# Patient Record
Sex: Female | Born: 1960
Health system: Southern US, Community
[De-identification: ages and names within clinical notes are randomized; demographics above are authoritative.]

## PROBLEM LIST (undated history)

## (undated) DIAGNOSIS — G4730 Sleep apnea, unspecified: Secondary | ICD-10-CM

## (undated) DIAGNOSIS — N189 Chronic kidney disease, unspecified: Secondary | ICD-10-CM

## (undated) DIAGNOSIS — H269 Unspecified cataract: Secondary | ICD-10-CM

## (undated) DIAGNOSIS — E785 Hyperlipidemia, unspecified: Secondary | ICD-10-CM

## (undated) DIAGNOSIS — N201 Calculus of ureter: Secondary | ICD-10-CM

## (undated) DIAGNOSIS — E079 Disorder of thyroid, unspecified: Secondary | ICD-10-CM

## (undated) DIAGNOSIS — T7840XA Allergy, unspecified, initial encounter: Secondary | ICD-10-CM

## (undated) DIAGNOSIS — E119 Type 2 diabetes mellitus without complications: Secondary | ICD-10-CM

## (undated) DIAGNOSIS — Z8719 Personal history of other diseases of the digestive system: Secondary | ICD-10-CM

## (undated) HISTORY — DX: Sleep apnea, unspecified: G47.30

## (undated) HISTORY — DX: Type 2 diabetes mellitus without complications: E11.9

## (undated) HISTORY — DX: Chronic kidney disease, unspecified: N18.9

## (undated) HISTORY — DX: Disorder of thyroid, unspecified: E07.9

## (undated) HISTORY — DX: Personal history of other diseases of the digestive system: Z87.19

## (undated) HISTORY — DX: Hyperlipidemia, unspecified: E78.5

## (undated) HISTORY — DX: Unspecified cataract: H26.9

## (undated) HISTORY — PX: OTHER SURGICAL HISTORY: SHX169

## (undated) HISTORY — DX: Allergy, unspecified, initial encounter: T78.40XA

---

## 1998-03-25 ENCOUNTER — Emergency Department (HOSPITAL_COMMUNITY): Admission: EM | Admit: 1998-03-25 | Discharge: 1998-03-25 | Payer: Self-pay | Admitting: Obstetrics & Gynecology

## 1998-06-16 ENCOUNTER — Encounter: Payer: Self-pay | Admitting: Family Medicine

## 1998-06-16 ENCOUNTER — Ambulatory Visit (HOSPITAL_COMMUNITY): Admission: RE | Admit: 1998-06-16 | Discharge: 1998-06-16 | Payer: Self-pay | Admitting: Family Medicine

## 1998-07-30 HISTORY — PX: NASAL SEPTUM SURGERY: SHX37

## 1999-10-16 ENCOUNTER — Other Ambulatory Visit: Admission: RE | Admit: 1999-10-16 | Discharge: 1999-10-16 | Payer: Self-pay | Admitting: *Deleted

## 1999-11-02 ENCOUNTER — Encounter (INDEPENDENT_AMBULATORY_CARE_PROVIDER_SITE_OTHER): Payer: Self-pay | Admitting: Specialist

## 1999-11-02 ENCOUNTER — Other Ambulatory Visit: Admission: RE | Admit: 1999-11-02 | Discharge: 1999-11-02 | Payer: Self-pay | Admitting: *Deleted

## 2000-05-04 ENCOUNTER — Ambulatory Visit (HOSPITAL_BASED_OUTPATIENT_CLINIC_OR_DEPARTMENT_OTHER): Admission: RE | Admit: 2000-05-04 | Discharge: 2000-05-04 | Payer: Self-pay | Admitting: *Deleted

## 2000-11-25 ENCOUNTER — Other Ambulatory Visit: Admission: RE | Admit: 2000-11-25 | Discharge: 2000-11-25 | Payer: Self-pay | Admitting: *Deleted

## 2001-05-29 ENCOUNTER — Encounter: Payer: Self-pay | Admitting: Family Medicine

## 2001-05-29 ENCOUNTER — Encounter: Admission: RE | Admit: 2001-05-29 | Discharge: 2001-05-29 | Payer: Self-pay | Admitting: Family Medicine

## 2001-09-04 ENCOUNTER — Encounter: Admission: RE | Admit: 2001-09-04 | Discharge: 2001-09-04 | Payer: Self-pay | Admitting: Obstetrics and Gynecology

## 2001-09-04 ENCOUNTER — Encounter: Payer: Self-pay | Admitting: Obstetrics and Gynecology

## 2001-11-26 ENCOUNTER — Other Ambulatory Visit: Admission: RE | Admit: 2001-11-26 | Discharge: 2001-11-26 | Payer: Self-pay | Admitting: *Deleted

## 2003-01-19 ENCOUNTER — Other Ambulatory Visit: Admission: RE | Admit: 2003-01-19 | Discharge: 2003-01-19 | Payer: Self-pay | Admitting: *Deleted

## 2005-12-02 ENCOUNTER — Encounter: Admission: RE | Admit: 2005-12-02 | Discharge: 2005-12-02 | Payer: Self-pay | Admitting: Orthopedic Surgery

## 2007-11-22 ENCOUNTER — Observation Stay (HOSPITAL_COMMUNITY): Admission: EM | Admit: 2007-11-22 | Discharge: 2007-11-23 | Payer: Self-pay | Admitting: Emergency Medicine

## 2008-02-12 ENCOUNTER — Ambulatory Visit (HOSPITAL_COMMUNITY): Admission: RE | Admit: 2008-02-12 | Discharge: 2008-02-12 | Payer: Self-pay | Admitting: Internal Medicine

## 2010-12-12 NOTE — H&P (Signed)
[name], [name] NO.:  [id number]   MEDICAL RECORD NO.:  [id number]          PATIENT TYPE:  EMS   LOCATION:  MAJO                         FACILITY:  [facility]   PHYSICIAN:  Marcellus Scott, MD     DATE OF BIRTH:  August 28, 1960   DATE OF ADMISSION:  11/22/2007  DATE OF DISCHARGE:                              HISTORY & PHYSICAL   PRIMARY MEDICAL DOCTOR:  Dr. Juline Patch.   GYNECOLOGIST:  At [facility].   CHIEF COMPLAINTS:  Headache, dry cough, chest pain, low-grade fever,  generalized body aches, diarrhea.   HISTORY OF PRESENTING ILLNESS:  [name] is a pleasant 50 year old  Caucasian female with a history of borderline diabetes, hyperlipidemia  and a mini stroke with no residual deficits sustained at age 61.  She  was in her usual state of health until yesterday morning.  The patient  woke up yesterday morning with a dull, aching headache on the vertex  which was moderate in intensity, nonradiating.  She indicates that she  has been having a headache maybe once every 3 weeks, which she thinks is  related to her stress since being laid off from her job in October of  2008.  By noon yesterday the patient started having a nonproductive  cough with associated retrosternal chest pain on coughing.  She also had  low-grade temperatures of 100 degrees Fahrenheit.  She had generalized  body aches.  She had a couple of episodes of diarrhea, which then  resolved.  There was no nausea, vomiting.  She had some chills but no  rigors.  Towards the evening she noticed that her chest pain moved  towards the left anterior upper chest, which was stabbing in nature,  worse on taking deep breaths and on movement of the chest wall.  If she  stayed still, the chest pain was minimal.  She also was unable to take  deep breaths secondary to the pain.   There is no history of recent long-distance travel.  There is no history  of sickly contacts.  She has 2  pet dogs which have  been with her for  years.  One of them is being treated for an ear infection.  There is no  history of weight loss or night sweats.  The patient indicates that she  has been doing yard work with her friend over the last week.  A week ago  she did some strenuous exercise in her yard when she planted some trees  and spread some mulch.  She denies lifting any heavy weights or  spraining her shoulder at that time.   PAST MEDICAL HISTORY:  1. Borderline diabetes.  2. Hyperlipidemia.  3. Mini stroke at age 32 years.  4. Nonsurgically treated left shoulder dislocation.  5. Hypothyroid   PAST SURGICAL HISTORY:  1. Bilateral jaw reduction surgery.  2. Deviated nasal septum surgery.   PSYCHIATRIC HISTORY:  Depression.   ALLERGIES:  SULFA OR SEPTRA CAUSE A RASH.   MEDICATIONS:  1. Sertraline 50 mg p.o. daily.  2. Synthroid 125 mcg p.o. daily.  3. Crestor 20 mg p.o. daily.  4. Fexofenadine 180 mg p.o. daily.  5. Previous oral contraceptive pills - stopped 2 months ago.   FAMILY HISTORY:  1. Brother age 54 years with history of question cytoblastoma.  2. Dad's demise at age 67 years from throat cancer.  He also had      alcoholism and Alzheimer's dementia.  3. Mom's demise at age 20 years from lung cancer.  She had      hyperlipidemia, hypertension and alcoholism.   SOCIAL HISTORY:  The patient is divorced.  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  There is no history of smoking or drug  abuse.  She socially consumes alcohol.   REVIEW OF SYSTEMS:  Fourteen systems reviewed and apart from history of  presenting illness, is contributory only for stuffiness of both ears.   PHYSICAL EXAMINATION:  [name] is a moderately-built and obese  female patient in no obvious distress.  Temperature maximum in the ED 100.5 degrees Fahrenheit.  Current  temperature 99.3 degrees Fahrenheit.  Blood pressure 133/70, pulse 90  per minute, respirations 20 per minute, saturating  at 99% on room air.  Head, eyes, ENT is nontraumatic, normocephalic.  Pupils equally reactive  to light and accommodation.  Oral cavity with no oropharyngeal erythema  or thrush.  Ear exam normal.  NECK:  Supple.  No JVD, carotid bruit.  LYMPHATICS:  No lymphadenopathy.  BREASTS:  Exam deferred.  RESPIRATORY SYSTEM:  Clear to auscultation.  Tenderness along the left  fourth, fifth and sixth costochondral junction and left upper anterior  chest.  CARDIOVASCULAR SYSTEM:  First and second heart sounds heard.  No third  or fourth heart sounds or murmurs.  ABDOMEN:  Obese.  Nontender.  No organomegaly or mass appreciated.  Bowel sounds are normally heard.  CENTRAL NERVOUS SYSTEM:  The patient is awake, alert, oriented x3.  No  focal neurological deficits.  EXTREMITIES:  With no cyanosis, clubbing or edema.  Peripheral pulses  symmetrically felt.  SKIN:  Without any rashes.   PERTINENT LAB DATA:  Point of care cardiac markers x2 negative  Electrolytes:  Glucose 154, otherwise normal.  BUN is 11, creatinine 1.  D-dimer elevated at 0.98.  CBCs:  Hemoglobin 13.3, hematocrit 38.8,  white blood cell 10.8, platelets 327.  EKG with normal sinus rhythm at  89 beats per minute, no acute changes, left anterior fascicular block.  Radiology:  CT angiogram of the chest.  Impression:  A.  There is no  specific evidence for acute pulmonary embolus.  B.  Bilateral pulmonary  nodules in an acute setting in a patient who has fever and chest pain;  this may represent septic emboli.  The differential diagnosis includes  pulmonary metastatic disease.  Granulomatous inflammation or infection  may have a similar appearance but is considered less favored.  Chest x-  ray:  Impression:  No active disease.   ASSESSMENT AND PLAN:  1. Atypical chest pain, low-grade temperature, myalgia.  Question      costochondritis, question viral syndrome.  Will admit the patient      to tele for 23-hour observation.  Will cycle  cardiac enzymes.  Will      obtain blood cultures, urinalysis, sputum culture, LFTs, ESR, ANA.      Will check urine histoplasma antigen and serum cryptococcal      antigen.  A differential diagnosis such as a fungal etiology      appears rare though.  We will place on  aspirin and nonsteroidal      anti-inflammatory drugs.  I have discussed her case in detail with      the infectious disease specialist on call, Dr. Clydie Braun.      He agrees with the above.  He has recommended the above evaluation.      At this time will hold antibiotic therapy.  I will also place a      purified protein derivative.  Will check flu antigens A and B.  2. Pulmonary nodules bilaterally - this seems to be an incidental      finding probably not related to her presentation.  In any case,      will proceed with evaluation as above.  3. Type 2 diabetes.  Will check hemoglobin A1c and place on sliding      scale insulin.  4. Hyperlipidemia.  Will check fasting lipids and place on home      Crestor.  5. Hypothyroidism.  Will check TSH and continue Synthroid.  6. Depression.  Sertraline.  7. Obesity.      Marcellus Scott, MD  Electronically Signed     AH/MEDQ  D:  11/22/2007  T:  11/22/2007  Job:  [id number]   cc:   Juline Patch, M.D.  Mick Sell, MD

## 2010-12-12 NOTE — Discharge Summary (Signed)
[name], [name]           ACCOUNT NO.:  [id number]   MEDICAL RECORD NO.:  [id number]          PATIENT TYPE:  OBV   LOCATION:  4731                         FACILITY:  [facility]   PHYSICIAN:  Marcellus Scott, MD     DATE OF BIRTH:  1961/06/30   DATE OF ADMISSION:  11/22/2007  DATE OF DISCHARGE:  11/23/2007                               DISCHARGE SUMMARY   PRIMARY MEDICAL DOCTOR:  Juline Patch, M.D.   DISCHARGE DIAGNOSES:  1. Atypical chest pain, likely secondary to costochondritis/question      acute viral syndrome.  2. Bilateral pulmonary nodules.  3. Type 2 diabetes.  4. Hyperlipidemia.  5. Hypothyroidism.  6. Depression.  7. Obesity.  8. Normocytic anemia.   DISCHARGE MEDICATIONS:  1. Sertraline 50 mg p.o. daily.  2. Synthroid 125 mcg p.o. daily.  3. Crestor 20 mg p.o. daily.  4. Fexofenadine 180 mg p.o. daily.  5. Enteric-coated aspirin 81 mg p.o. daily.  6. Omeprazole 20 mg p.o. daily.  7. Ibuprofen 200 mg tablets, 3 tablets p.o. three times daily with      meals for 3 days and then as needed.   PROCEDURES:  1. CT angiogram of the chest.  Impression:      a.     There is no specific evidence for acute pulmonary embolus.      b.     Bilateral pulmonary nodules.  In the acute setting in a       patient who has fever and chest pain, this may represent septic       emboli.  The differential diagnosis includes pulmonary metastatic       disease.  Granulomatous inflammation or infection may have a       similar appearance but is considered less favored.  2. Chest x-ray.  Impression:  No active disease.   PERTINENT LABORATORY DATA:  Serum cryptococcal antigen negative.  Cardiac panel cycled and negative.  Lipid panel with cholesterol 176,  triglycerides 255, HDL 26, LDL 96, VLDL 55.  CBC:  Hemoglobin 11.7,  hematocrit 34.3, white blood cell 8.5, platelets 271.  Influenza A and B  antigen negative.  ESR is 39.  Hepatic panel unremarkable except for  albumin of 3.2.   Urinalysis negative.  D-dimer elevated at 0.98.   CONSULTATION:  Infectious disease Dr. Clydie Braun.   HOSPITAL COURSE AND DISPOSITION:  Please refer to the history and  physical note by this dictator for initial admission details.  In  summary, [name] is a pleasant 50 year old female patient with  history of borderline diabetes, hyperlipidemia and depression who is in  good health and was in her usual state of health until the day prior to  admission.  She started experiencing mild headache, low grade  temperatures, muscle aches, and nonproductive dry cough, chest pain  which was retrosternal on coughing initially which progressively got  worse with increasing pain on deep inspiration.  For these symptoms, the  patient presented to the emergency room.  Her EKG did not show any acute  findings.  Her D-dimer was mildly elevated.  The patient thereby got CT  angiogram of the chest which revealed bilateral pulmonary nodules but no  PE.  The radiologist opined that in the context of chest pain, a fever  and pulmonary nodules to consider septic emboli.  However, the patient  looked nontoxic.  She had no leukocytosis.  She had a temperature  maximum of 100.5 degrees Fahrenheit.  Physical examination was only  remarkable for tenderness in the left fourth, fifth and sixth  costochondral junctions and left upper anterior chest.  She was thereby  admitted for 23-hour observation for further evaluation.  Telemetry  revealed sinus rhythm with no alarms.  She has had no further fevers.  Her white cell counts are normal.  She was placed on NSAIDs.  Today, she  is feeling much better.  Her chest pains are down to 4/10 from 8-9/10.  She has minimal to no tenderness in the left costochondral junctions and  in the left anterior upper chest.  Her workup thus far is nonrevealing.  PPD has been placed today and has to be read on November 25, 2007, at the  primary medical doctor's office.  Her TSH,  A1c, ANA, blood cultures,  urine Histoplasma antigen are pending.  We are awaiting the infectious  disease physician to evaluate her and the patient will potentially be  discharged today on NSAIDs.  She is to follow up with her PMD in 2 days'  time.  She has been instructed to seek immediate medical attention if  there is any further deterioration of her condition.  Her presentation  is suggestive of costochondritis, question viral syndrome.  The  pulmonary nodules may have been  an incidental finding.  The patient has  no history of smoking.  She will need evaluation with repeat CT scan  probably in 12 months' time or as deemed necessary.      Marcellus Scott, MD  Electronically Signed     AH/MEDQ  D:  11/23/2007  T:  11/23/2007  Job:  [id number]   cc:   Juline Patch, M.D.  Mick Sell, MD

## 2010-12-15 NOTE — Discharge Summary (Signed)
[name], [name]           ACCOUNT NO.:  [id number]   MEDICAL RECORD NO.:  [id number]          PATIENT TYPE:  OBV   LOCATION:  4731                         FACILITY:  [facility]   PHYSICIAN:  Marcellus Scott, MD     DATE OF BIRTH:  03/26/1961   DATE OF ADMISSION:  11/22/2007  DATE OF DISCHARGE:  11/23/2007                               DISCHARGE SUMMARY   ADDENDUM:   PRIMARY CARE PHYSICIAN:  Juline Patch, M.D.   This is an addendum to the Discharge Summary that was done by this  dictator on November 23, 2007.   On reviewing the patient's lab data which returned after the patient was  discharged, her ANA returned positive with titer of 820 in a speckled  pattern.  I called Dr. Ricki Miller at his office on May 19 and updated him with  these results.  He indicated that he was in touch with the patient and  would follow her up for further evaluation as deemed necessary.      Marcellus Scott, MD  Electronically Signed     AH/MEDQ  D:  12/23/2007  T:  12/23/2007  Job:  [id number]   cc:   Juline Patch, M.D.  Mick Sell, MD

## 2011-04-24 LAB — POCT I-STAT, CHEM 8
Chloride: 101
Creatinine, Ser: 1
Glucose, Bld: 154 — ABNORMAL HIGH
HCT: 41
Hemoglobin: 13.9
Potassium: 4
Sodium: 138

## 2011-04-24 LAB — TSH: TSH: 3.89

## 2011-04-24 LAB — URINALYSIS, ROUTINE W REFLEX MICROSCOPIC
Bilirubin Urine: NEGATIVE
Hgb urine dipstick: NEGATIVE
Nitrite: NEGATIVE
Protein, ur: NEGATIVE
Urobilinogen, UA: 0.2

## 2011-04-24 LAB — CBC
HCT: 38.8
Hemoglobin: 13.3
MCHC: 34.2
MCHC: 34.4
MCV: 85.6
RBC: 4
RBC: 4.56
RDW: 13.7
RDW: 13.8

## 2011-04-24 LAB — POCT CARDIAC MARKERS
CKMB, poc: 1
CKMB, poc: 1.2
Myoglobin, poc: 48.5
Myoglobin, poc: 68.8
Troponin i, poc: <0.05

## 2011-04-24 LAB — DIFFERENTIAL
Basophils Absolute: 0
Basophils Relative: 0
Eosinophils Relative: 4
Monocytes Absolute: 0.6
Monocytes Relative: 5
Neutro Abs: 8.2 — ABNORMAL HIGH

## 2011-04-24 LAB — TROPONIN I: Troponin I: 0.01

## 2011-04-24 LAB — LIPID PANEL
Cholesterol: 176
HDL: 26 — ABNORMAL LOW
Triglycerides: 255 — ABNORMAL HIGH

## 2011-04-24 LAB — CULTURE, BLOOD (ROUTINE X 2)

## 2011-04-24 LAB — HISTOPLASMA ANTIGEN, URINE
Histoplasma Antigen Urine: NEGATIVE
Histoplasma Antigen, urine: <2 {EIA'U}

## 2011-04-24 LAB — CARDIAC PANEL(CRET KIN+CKTOT+MB+TROPI)
CK, MB: 0.7
Relative Index: INVALID
Relative Index: INVALID
Troponin I: 0.01
Troponin I: 0.01

## 2011-04-24 LAB — ANA: Anti Nuclear Antibody(ANA): POSITIVE — AB

## 2011-04-24 LAB — HEPATIC FUNCTION PANEL
Bilirubin, Direct: 0.1
Indirect Bilirubin: 0.3
Total Bilirubin: 0.4

## 2011-04-24 LAB — CK TOTAL AND CKMB (NOT AT ARMC)
CK, MB: 1.1
Relative Index: INVALID
Total CK: 68

## 2011-04-24 LAB — ANTI-NUCLEAR AB-TITER (ANA TITER)

## 2011-04-24 LAB — SEDIMENTATION RATE: Sed Rate: 39 — ABNORMAL HIGH

## 2011-04-24 LAB — CRYPTOCOCCAL ANTIGEN: Crypto Ag: NEGATIVE

## 2012-05-27 ENCOUNTER — Encounter: Payer: Self-pay | Admitting: Gastroenterology

## 2012-06-03 ENCOUNTER — Ambulatory Visit (AMBULATORY_SURGERY_CENTER): Payer: 59

## 2012-06-03 VITALS — Ht 64.0 in | Wt 245.5 lb

## 2012-06-03 DIAGNOSIS — Z1211 Encounter for screening for malignant neoplasm of colon: Secondary | ICD-10-CM

## 2012-06-03 MED ORDER — NA SULFATE-K SULFATE-MG SULF 17.5-3.13-1.6 GM/177ML PO SOLN: 1.0000 | Freq: Once | ORAL | Status: DC

## 2012-06-04 ENCOUNTER — Encounter: Payer: Self-pay | Admitting: Gastroenterology

## 2012-06-20 ENCOUNTER — Encounter: Payer: Self-pay | Admitting: Gastroenterology

## 2012-06-20 ENCOUNTER — Ambulatory Visit (AMBULATORY_SURGERY_CENTER): Payer: 59 | Admitting: Gastroenterology

## 2012-06-20 VITALS — BP 115/74 | HR 73 | Temp 98.3°F | Resp 27 | Ht 64.0 in | Wt 245.0 lb

## 2012-06-20 DIAGNOSIS — Z1211 Encounter for screening for malignant neoplasm of colon: Secondary | ICD-10-CM

## 2012-06-20 DIAGNOSIS — D126 Benign neoplasm of colon, unspecified: Secondary | ICD-10-CM

## 2012-06-20 DIAGNOSIS — K5730 Diverticulosis of large intestine without perforation or abscess without bleeding: Secondary | ICD-10-CM

## 2012-06-20 LAB — GLUCOSE, CAPILLARY
Glucose-Capillary: 160 mg/dL — ABNORMAL HIGH (ref 70–99)
Glucose-Capillary: 145 mg/dL — ABNORMAL HIGH (ref 70–99)

## 2012-06-20 MED ORDER — SODIUM CHLORIDE 0.9 % IV SOLN: 500.0000 mL | INTRAVENOUS | Status: DC

## 2012-06-20 NOTE — Patient Instructions (Addendum)
YOU HAD AN ENDOSCOPIC PROCEDURE TODAY AT THE [facility]: Refer to the procedure report that was given to you for any specific questions about what was found during the examination.  If the procedure report does not answer your questions, please call your gastroenterologist to clarify.  If you requested that your care partner not be given the details of your procedure findings, then the procedure report has been included in a sealed envelope for you to review at your convenience later.  YOU SHOULD EXPECT: Some feelings of bloating in the abdomen. Passage of more gas than usual.  Walking can help get rid of the air that was put into your GI tract during the procedure and reduce the bloating. If you had a lower endoscopy (such as a colonoscopy or flexible sigmoidoscopy) you may notice spotting of blood in your stool or on the toilet paper. If you underwent a bowel prep for your procedure, then you may not have a normal bowel movement for a few days.  DIET: Your first meal following the procedure should be a light meal and then it is ok to progress to your normal diet.  A half-sandwich or bowl of soup is an example of a good first meal.  Heavy or fried foods are harder to digest and may make you feel nauseous or bloated.  Likewise meals heavy in dairy and vegetables can cause extra gas to form and this can also increase the bloating.  Drink plenty of fluids but you should avoid alcoholic beverages for 24 hours.  ACTIVITY: Your care partner should take you home directly after the procedure.  You should plan to take it easy, moving slowly for the rest of the day.  You can resume normal activity the day after the procedure however you should NOT DRIVE or use heavy machinery for 24 hours (because of the sedation medicines used during the test).    SYMPTOMS TO REPORT IMMEDIATELY: A gastroenterologist can be reached at any hour.  During normal business hours, 8:30 AM to 5:00 PM Monday through Friday,  call [phone].  After hours and on weekends, please call the GI answering service at [phone] who will take a message and have the physician on call contact you.   Following lower endoscopy (colonoscopy or flexible sigmoidoscopy):  Excessive amounts of blood in the stool  Significant tenderness or worsening of abdominal pains  Swelling of the abdomen that is new, acute  Fever of 100F or higher  Black, tarry-looking stools  FOLLOW UP: If any biopsies were taken you will be contacted by phone or by letter within the next 1-3 weeks.  Call your gastroenterologist if you have not heard about the biopsies in 3 weeks.  Our staff will call the home number listed on your records the next business day following your procedure to check on you and address any questions or concerns that you may have at that time regarding the information given to you following your procedure. This is a courtesy call and so if there is no answer at the home number and we have not heard from you through the emergency physician on call, we will assume that you have returned to your regular daily activities without incident.  SIGNATURES/CONFIDENTIALITY: You and/or your care partner have signed paperwork which will be entered into your electronic medical record.  These signatures attest to the fact that that the information above on your After Visit Summary has been reviewed and is understood.  Full responsibility of   the confidentiality of this discharge information lies with you and/or your care-partner.     Handout on polyps and diverticulosis with high fiber diet

## 2012-06-20 NOTE — Op Note (Signed)
[facility] [address] [place], [address]   COLONOSCOPY PROCEDURE REPORT  PATIENT: [name], [name]  MR#: [id number] BIRTHDATE: [birth year] , 51  yrs. old GENDER: Female ENDOSCOPIST: Louis Meckel, MD REFERRED [id number] Ricki Miller, M.D. PROCEDURE DATE:  06/20/2012 PROCEDURE:   Colonoscopy with cold biopsy polypectomy ASA CLASS:   Class II INDICATIONS:average risk screening. MEDICATIONS: MAC sedation, administered by CRNA and propofol (Diprivan) 200mg  IV  DESCRIPTION OF PROCEDURE:   After the risks benefits and alternatives of the procedure were thoroughly explained, informed consent was obtained.  A digital rectal exam revealed no abnormalities of the rectum.   The LB CF-[id number]  endoscope was introduced through the anus and advanced to the cecum, which was identified by both the appendix and ileocecal valve. No adverse events experienced.   The quality of the prep was Suprep excellent The instrument was then slowly withdrawn as the colon was fully examined.      COLON FINDINGS: A sessile polyp was found at the hepatic flexure.  A polypectomy was performed with cold forceps.  The resection was complete and the polyp tissue was completely retrieved.   Moderate diverticulosis was noted in the sigmoid colon.   The colon mucosa was otherwise normal.  Retroflexed views revealed no abnormalities. The time to cecum=2 minutes 40 seconds.  Withdrawal time=6 minutes 27 seconds.  The scope was withdrawn and the procedure completed. COMPLICATIONS: There were no complications.  ENDOSCOPIC IMPRESSION: 1.   Sessile polyp was found at the hepatic flexure; polypectomy was performed with cold forceps 2.   Moderate diverticulosis was noted in the sigmoid colon 3.   The colon mucosa was otherwise normal  RECOMMENDATIONS: If the polyp(s) removed today are proven to be adenomatous (pre-cancerous) polyps, you will need a repeat colonoscopy in 5 years.  Otherwise  you should continue to follow colorectal cancer screening guidelines for "routine risk" patients with colonoscopy in 10 years.  You will receive a letter within 1-2 weeks with the results of your biopsy as well as final recommendations.  Please call my office if you have not received a letter after 3 weeks.   eSigned:  Louis Meckel, MD 06/20/2012 9:02 AM   cc:

## 2012-06-20 NOTE — Progress Notes (Signed)
Patient did not experience any of the following events: a burn prior to discharge; a fall within the facility; wrong site/side/patient/procedure/implant event; or a hospital transfer or hospital admission upon discharge from the facility. (G8907) Patient did not have preoperative order for IV antibiotic SSI prophylaxis. (G8918)  

## 2012-06-23 ENCOUNTER — Telehealth: Payer: Self-pay | Admitting: *Deleted

## 2012-06-23 NOTE — Telephone Encounter (Signed)
No answer.Message left for the patient. 

## 2012-06-30 ENCOUNTER — Encounter: Payer: Self-pay | Admitting: Gastroenterology

## 2013-06-15 ENCOUNTER — Other Ambulatory Visit: Payer: Self-pay | Admitting: Radiology

## 2015-07-31 HISTORY — PX: SHOULDER SURGERY: SHX246

## 2015-09-25 ENCOUNTER — Emergency Department (HOSPITAL_BASED_OUTPATIENT_CLINIC_OR_DEPARTMENT_OTHER)
Admission: EM | Admit: 2015-09-25 | Discharge: 2015-09-25 | Disposition: A | Discharge status: Home / Self Care | Payer: Managed Care, Other (non HMO) | Attending: Emergency Medicine | Admitting: Emergency Medicine

## 2015-09-25 ENCOUNTER — Emergency Department (HOSPITAL_BASED_OUTPATIENT_CLINIC_OR_DEPARTMENT_OTHER): Payer: Managed Care, Other (non HMO)

## 2015-09-25 ENCOUNTER — Encounter (HOSPITAL_BASED_OUTPATIENT_CLINIC_OR_DEPARTMENT_OTHER): Payer: Self-pay | Admitting: *Deleted

## 2015-09-25 DIAGNOSIS — W19XXXA Unspecified fall, initial encounter: Secondary | ICD-10-CM

## 2015-09-25 DIAGNOSIS — E119 Type 2 diabetes mellitus without complications: Secondary | ICD-10-CM | POA: Insufficient documentation

## 2015-09-25 DIAGNOSIS — M542 Cervicalgia: Secondary | ICD-10-CM

## 2015-09-25 DIAGNOSIS — E079 Disorder of thyroid, unspecified: Secondary | ICD-10-CM | POA: Diagnosis not present

## 2015-09-25 DIAGNOSIS — W108XXA Fall (on) (from) other stairs and steps, initial encounter: Secondary | ICD-10-CM | POA: Insufficient documentation

## 2015-09-25 DIAGNOSIS — Z79899 Other long term (current) drug therapy: Secondary | ICD-10-CM | POA: Diagnosis not present

## 2015-09-25 DIAGNOSIS — N189 Chronic kidney disease, unspecified: Secondary | ICD-10-CM | POA: Insufficient documentation

## 2015-09-25 DIAGNOSIS — Y9301 Activity, walking, marching and hiking: Secondary | ICD-10-CM | POA: Diagnosis not present

## 2015-09-25 DIAGNOSIS — Y9289 Other specified places as the place of occurrence of the external cause: Secondary | ICD-10-CM | POA: Diagnosis not present

## 2015-09-25 DIAGNOSIS — S29002A Unspecified injury of muscle and tendon of back wall of thorax, initial encounter: Secondary | ICD-10-CM | POA: Insufficient documentation

## 2015-09-25 DIAGNOSIS — S0990XA Unspecified injury of head, initial encounter: Secondary | ICD-10-CM | POA: Diagnosis not present

## 2015-09-25 DIAGNOSIS — E785 Hyperlipidemia, unspecified: Secondary | ICD-10-CM | POA: Diagnosis not present

## 2015-09-25 DIAGNOSIS — Y998 Other external cause status: Secondary | ICD-10-CM | POA: Diagnosis not present

## 2015-09-25 DIAGNOSIS — Z793 Long term (current) use of hormonal contraceptives: Secondary | ICD-10-CM | POA: Insufficient documentation

## 2015-09-25 DIAGNOSIS — S3992XA Unspecified injury of lower back, initial encounter: Secondary | ICD-10-CM | POA: Insufficient documentation

## 2015-09-25 DIAGNOSIS — S199XXA Unspecified injury of neck, initial encounter: Secondary | ICD-10-CM | POA: Diagnosis present

## 2015-09-25 DIAGNOSIS — Z792 Long term (current) use of antibiotics: Secondary | ICD-10-CM | POA: Insufficient documentation

## 2015-09-25 DIAGNOSIS — R51 Headache: Secondary | ICD-10-CM

## 2015-09-25 DIAGNOSIS — Z794 Long term (current) use of insulin: Secondary | ICD-10-CM | POA: Diagnosis not present

## 2015-09-25 DIAGNOSIS — R519 Headache, unspecified: Secondary | ICD-10-CM

## 2015-09-25 DIAGNOSIS — Z7984 Long term (current) use of oral hypoglycemic drugs: Secondary | ICD-10-CM | POA: Diagnosis not present

## 2015-09-25 DIAGNOSIS — M549 Dorsalgia, unspecified: Secondary | ICD-10-CM

## 2015-09-25 MED ORDER — METHOCARBAMOL 500 MG PO TABS: 500.0000 mg | ORAL_TABLET | Freq: Two times a day (BID) | ORAL | Status: DC

## 2015-09-25 MED ORDER — IBUPROFEN 600 MG PO TABS: 600.0000 mg | ORAL_TABLET | Freq: Four times a day (QID) | ORAL | Status: DC | PRN

## 2015-09-25 NOTE — Discharge Instructions (Signed)
1. Medications: ibuprofen, robaxin, usual home medications 2. Treatment: rest, drink plenty of fluids  3. Follow Up: please followup with your primary doctor for discussion of your diagnoses and further evaluation after today's visit; if you do not have a primary care doctor use the resource guide provided to find one; please return to the ER for severe headache, numbness, weakness, new or worsening symptoms   Back Pain, Adult Back pain is very common in adults.The cause of back pain is rarely dangerous and the pain often gets better over time.The cause of your back pain may not be known. Some common causes of back pain include:  Strain of the muscles or ligaments supporting the spine.  Wear and tear (degeneration) of the spinal disks.  Arthritis.  Direct injury to the back. For many people, back pain may return. Since back pain is rarely dangerous, most people can learn to manage this condition on their own. HOME CARE INSTRUCTIONS Watch your back pain for any changes. The following actions may help to lessen any discomfort you are feeling:  Remain active. It is stressful on your back to sit or stand in one place for long periods of time. Do not sit, drive, or stand in one place for more than 30 minutes at a time. Take short walks on even surfaces as soon as you are able.Try to increase the length of time you walk each day.  Exercise regularly as directed by your health care provider. Exercise helps your back heal faster. It also helps avoid future injury by keeping your muscles strong and flexible.  Do not stay in bed.Resting more than 1-2 days can delay your recovery.  Pay attention to your body when you bend and lift. The most comfortable positions are those that put less stress on your recovering back. Always use proper lifting techniques, including:  Bending your knees.  Keeping the load close to your body.  Avoiding twisting.  Find a comfortable position to sleep. Use a  firm mattress and lie on your side with your knees slightly bent. If you lie on your back, put a pillow under your knees.  Avoid feeling anxious or stressed.Stress increases muscle tension and can worsen back pain.It is important to recognize when you are anxious or stressed and learn ways to manage it, such as with exercise.  Take medicines only as directed by your health care provider. Over-the-counter medicines to reduce pain and inflammation are often the most helpful.Your health care provider may prescribe muscle relaxant drugs.These medicines help dull your pain so you can more quickly return to your normal activities and healthy exercise.  Apply ice to the injured area:  Put ice in a plastic bag.  Place a towel between your skin and the bag.  Leave the ice on for 20 minutes, 2-3 times a day for the first 2-3 days. After that, ice and heat may be alternated to reduce pain and spasms.  Maintain a healthy weight. Excess weight puts extra stress on your back and makes it difficult to maintain good posture. SEEK MEDICAL CARE IF:  You have pain that is not relieved with rest or medicine.  You have increasing pain going down into the legs or buttocks.  You have pain that does not improve in one week.  You have night pain.  You lose weight.  You have a fever or chills. SEEK IMMEDIATE MEDICAL CARE IF:   You develop new bowel or bladder control problems.  You have unusual weakness or numbness  in your arms or legs.  You develop nausea or vomiting.  You develop abdominal pain.  You feel faint.   This information is not intended to replace advice given to you by your health care provider. Make sure you discuss any questions you have with your health care provider.   Document Released: 07/16/2005 Document Revised: 08/06/2014 Document Reviewed: 11/17/2013 Elsevier Interactive Patient Education [employer].

## 2015-09-25 NOTE — ED Notes (Addendum)
Pt fell down stairs yesterday.  States that she continues to have a HA on the left side of her head from it.  Denies LOC.  PERRLA

## 2015-09-25 NOTE — ED Provider Notes (Signed)
CSN: [id number]     Arrival date & time 09/25/15  1322 History   First MD Initiated Contact with Patient 09/25/15 1508     Chief Complaint  Patient presents with  . Fall    HPI   [name] is a 55 y.o. female with a PMH of DM, HLD, CKD who presents to the ED s/p fall. She states she tripped over her dog while walking down stairs yesterday and fell down approximately 10 carpeted steps. She reports headache, neck pain, and back pain since that time. She denies exacerbating or alleviating factors. She states she hit her head, though does not think she lost consciousness. She denies lightheadedness, dizziness, vision changes, chest pain, abdominal pain, N/V, numbness, weakness, paresthesia, bowel or bladder incontinence, saddle anesthesia, history of malignancy, IVDU, anticoagulant use.   Past Medical History  Diagnosis Date  . Diabetes mellitus without complication ([place])   . Chronic kidney disease   . Hyperlipidemia   . Thyroid disease    Past Surgical History  Procedure Laterality Date  . Nasal septum surgery  2000  . Jaw reduction      1984   Family History  Problem Relation Age of Onset  . Esophageal cancer Father    Social History  Substance Use Topics  . Smoking status: Never Smoker   . Smokeless tobacco: Never Used  . Alcohol Use: No   OB History    No data available      Review of Systems  Cardiovascular: Negative for chest pain.  Gastrointestinal: Negative for nausea, vomiting and abdominal pain.  Musculoskeletal: Positive for back pain and neck pain.  Neurological: Positive for headaches. Negative for dizziness, syncope, weakness, light-headedness and numbness.  All other systems reviewed and are negative.     Allergies  Septra  Home Medications   Prior to Admission medications   Medication Sig Start Date End Date Taking? Authorizing Provider  insulin detemir (LEVEMIR) 100 UNIT/ML injection Inject into the skin at bedtime.   Yes Historical  Provider, MD  metFORMIN (GLUCOPHAGE) 500 MG tablet Take by mouth 2 (two) times daily with a meal.   Yes Historical Provider, MD  aspirin 325 MG EC tablet Take 325 mg by mouth daily.    Historical Provider, MD  atorvastatin (LIPITOR) 20 MG tablet Take 20 mg by mouth daily.    Historical Provider, MD  Cetirizine HCl (ZYRTEC PO) Take by mouth as needed.    Historical Provider, MD  Cholecalciferol (VITAMIN D-3) 5000 UNITS TABS Take by mouth.    Historical Provider, MD  ibuprofen (ADVIL,MOTRIN) 600 MG tablet Take 1 tablet (600 mg total) by mouth every 6 (six) hours as needed. 09/25/15   Marella Chimes, PA-C  levothyroxine (SYNTHROID, LEVOTHROID) 137 MCG tablet Take 137 mcg by mouth daily.    Historical Provider, MD  methocarbamol (ROBAXIN) 500 MG tablet Take 1 tablet (500 mg total) by mouth 2 (two) times daily. 09/25/15   Marella Chimes, PA-C  norethindrone-ethinyl estradiol (JUNEL FE,GILDESS FE,LOESTRIN FE) 1-20 MG-MCG tablet Take 1 tablet by mouth daily.    Historical Provider, MD  ramipril (ALTACE) 2.5 MG capsule Take 2.5 mg by mouth daily.    Historical Provider, MD    BP 151/69 mmHg  Pulse 64  Temp(Src) 99 F (37.2 C) (Oral)  Resp 18  Ht 5\' 4"  (1.626 m)  Wt 108.863 kg  BMI 41.18 kg/m2  SpO2 98% Physical Exam  Constitutional: She is oriented to person, place, and time. She appears  well-developed and well-nourished. No distress.  HENT:  Head: Normocephalic and atraumatic. Head is without raccoon's eyes, without Battle's sign, without abrasion, without contusion and without laceration.  Right Ear: External ear normal. No hemotympanum.  Left Ear: External ear normal. No hemotympanum.  Nose: Nose normal.  Mouth/Throat: Uvula is midline, oropharynx is clear and moist and mucous membranes are normal.  Eyes: Conjunctivae, EOM and lids are normal. Pupils are equal, round, and reactive to light. Right eye exhibits no discharge. Left eye exhibits no discharge. No scleral icterus.  Neck:  Normal range of motion. Neck supple.  Cardiovascular: Normal rate, regular rhythm, normal heart sounds, intact distal pulses and normal pulses.   Pulmonary/Chest: Effort normal and breath sounds normal. No respiratory distress. She has no wheezes. She has no rales. She exhibits no tenderness.  Abdominal: Soft. Normal appearance and bowel sounds are normal. She exhibits no distension and no mass. There is no tenderness. There is no rigidity, no rebound and no guarding.  Musculoskeletal: Normal range of motion. She exhibits tenderness. She exhibits no edema.  Diffuse TTP to cervical, thoracic, and lumbar spine. No palpable step-off or deformity.  Neurological: She is alert and oriented to person, place, and time. She has normal strength. No cranial nerve deficit or sensory deficit.  Skin: Skin is warm, dry and intact. No rash noted. She is not diaphoretic. No erythema. No pallor.  Psychiatric: She has a normal mood and affect. Her speech is normal and behavior is normal.  Nursing note and vitals reviewed.   ED Course  Procedures (including critical care time)  Labs Review Labs Reviewed - No data to display  Imaging Review Dg Thoracic Spine W/swimmers  09/25/2015  CLINICAL DATA:  Patient with thoracic spine pain status post fall down the stairs. EXAM: THORACIC SPINE - 3 VIEWS COMPARISON:  Chest radiograph 01/13/2010. FINDINGS: There is no evidence of thoracic spine fracture. Alignment is normal. No other significant bone abnormalities are identified. IMPRESSION: No acute osseous abnormality. Electronically Signed   By: Lovey Newcomer M.D.   On: 09/25/2015 16:22   Dg Lumbar Spine Complete  09/25/2015  CLINICAL DATA:  Low back pain following fall down stairs, initial encounter EXAM: LUMBAR SPINE - COMPLETE 4+ VIEW COMPARISON:  None. FINDINGS: Five lumbar type vertebral bodies are well visualized. Vertebral body height is well maintained. Mild osteophytic changes are seen. No anterolisthesis is noted.  No soft tissue changes are seen. IMPRESSION: Degenerative change without acute abnormality. Electronically Signed   By: Inez Catalina M.D.   On: 09/25/2015 16:26   Ct Head Wo Contrast  09/25/2015  CLINICAL DATA:  Fall down stairs yesterday with headaches and neck pain, initial encounter EXAM: CT HEAD WITHOUT CONTRAST CT CERVICAL SPINE WITHOUT CONTRAST TECHNIQUE: Multidetector CT imaging of the head and cervical spine was performed following the standard protocol without intravenous contrast. Multiplanar CT image reconstructions of the cervical spine were also generated. COMPARISON:  None. FINDINGS: CT HEAD FINDINGS Bony calvarium is intact. No findings to suggest acute hemorrhage, acute infarction or space-occupying mass lesion are noted. CT CERVICAL SPINE FINDINGS Seven cervical segments are well visualized. Vertebral body height is well maintained. Mild osteophytic changes are noted at C5-6 and C6-7. Mild facet hypertrophic changes are noted at multiple levels. No acute fracture or acute facet abnormality is seen. The surrounding soft tissue structures and visualized lung apices are within normal limits. Changes of prior mandibular fixation are noted. IMPRESSION: CT of head: No acute intracranial abnormality noted. CT of the cervical  spine: Degenerative change without acute abnormality. Electronically Signed   By: Inez Catalina M.D.   On: 09/25/2015 15:54   Ct Cervical Spine Wo Contrast  09/25/2015  CLINICAL DATA:  Fall down stairs yesterday with headaches and neck pain, initial encounter EXAM: CT HEAD WITHOUT CONTRAST CT CERVICAL SPINE WITHOUT CONTRAST TECHNIQUE: Multidetector CT imaging of the head and cervical spine was performed following the standard protocol without intravenous contrast. Multiplanar CT image reconstructions of the cervical spine were also generated. COMPARISON:  None. FINDINGS: CT HEAD FINDINGS Bony calvarium is intact. No findings to suggest acute hemorrhage, acute infarction or  space-occupying mass lesion are noted. CT CERVICAL SPINE FINDINGS Seven cervical segments are well visualized. Vertebral body height is well maintained. Mild osteophytic changes are noted at C5-6 and C6-7. Mild facet hypertrophic changes are noted at multiple levels. No acute fracture or acute facet abnormality is seen. The surrounding soft tissue structures and visualized lung apices are within normal limits. Changes of prior mandibular fixation are noted. IMPRESSION: CT of head: No acute intracranial abnormality noted. CT of the cervical spine: Degenerative change without acute abnormality. Electronically Signed   By: Inez Catalina M.D.   On: 09/25/2015 15:54    I have personally reviewed and evaluated these images and lab results as part of my medical decision-making.   EKG Interpretation None      MDM   Final diagnoses:  Fall  Back pain, unspecified location  Headache, unspecified headache type  Neck pain    55 year old female presents with headache, neck pain, and back pain after falling yesterday. States she hit her head but does not think she lost consciousness. Denies lightheadedness, dizziness, vision changes, chest pain, abdominal pain, N/V, numbness, weakness, paresthesia, bowel or bladder incontinence, saddle anesthesia, history of malignancy, IVDU, anticoagulant use.  Patient is afebrile. Vital signs stable. GCS 15. Normal neuro exam with no focal deficit. Head normocephalic and atraumatic. Diffuse tenderness to palpation to cervical, thoracic, and lumbar spine and paraspinal muscles. No palpable step-off or deformity. Full range of motion of upper and lower extremities bilaterally.  Patient declines pain medication. Head CT negative for acute intracranial abnormality. Cervical spine CT remarkable for degenerative change without acute abnormality. Imaging of thoracic and lumbar spine negative for acute abnormality. Spoke with patient regarding findings. Symptoms likely consistent  with concussion / muscular pain. Will treat with muscle relaxant and anti-inflammatory. Patient to follow up with PCP this week. Strict return precautions discussed. Patient verbalizes her understanding and is in agreement with plan.  BP 151/69 mmHg  Pulse 64  Temp(Src) 99 F (37.2 C) (Oral)  Resp 18  Ht 5\' 4"  (1.626 m)  Wt 108.863 kg  BMI 41.18 kg/m2  SpO2 98%      Marella Chimes, PA-C 09/25/15 1644  Quintella Reichert, MD 09/26/15 1328

## 2015-09-25 NOTE — ED Notes (Addendum)
Pt fell down approx 10 carpeted steps, landing at bottom, hitting her coccyx, diffuse back along spine and right side, and mid neck.  Pt reports diffuse soreness along length of spine and neck with palpation.  No palpable swelling noted to site of impact on patient's posterior lower left skull.  Pt denies loss of consciousness, nausea, vomiting or other symptoms.  States pain is most pronounced in area of tailbone.

## 2015-09-25 NOTE — ED Notes (Signed)
Pt took Aleve at home at approx 0930 this morning.

## 2015-12-23 ENCOUNTER — Other Ambulatory Visit: Payer: Self-pay | Admitting: Orthopaedic Surgery

## 2015-12-23 DIAGNOSIS — M25512 Pain in left shoulder: Secondary | ICD-10-CM

## 2016-01-05 ENCOUNTER — Other Ambulatory Visit: Payer: Managed Care, Other (non HMO)

## 2016-01-06 ENCOUNTER — Ambulatory Visit
Admission: RE | Admit: 2016-01-06 | Discharge: 2016-01-06 | Disposition: A | Discharge status: Home / Self Care | Payer: Managed Care, Other (non HMO) | Source: Ambulatory Visit | Attending: Orthopaedic Surgery | Admitting: Orthopaedic Surgery

## 2016-01-06 DIAGNOSIS — M25512 Pain in left shoulder: Secondary | ICD-10-CM

## 2017-04-22 ENCOUNTER — Ambulatory Visit
Admission: RE | Admit: 2017-04-22 | Discharge: 2017-04-22 | Disposition: A | Discharge status: Home / Self Care | Payer: 59 | Source: Ambulatory Visit | Attending: Family Medicine | Admitting: Family Medicine

## 2017-04-22 ENCOUNTER — Other Ambulatory Visit: Payer: Self-pay | Admitting: Family Medicine

## 2017-04-22 DIAGNOSIS — N23 Unspecified renal colic: Secondary | ICD-10-CM

## 2017-04-26 ENCOUNTER — Other Ambulatory Visit: Payer: Self-pay | Admitting: Family Medicine

## 2017-04-26 DIAGNOSIS — R9389 Abnormal findings on diagnostic imaging of other specified body structures: Secondary | ICD-10-CM

## 2017-04-26 DIAGNOSIS — N838 Other noninflammatory disorders of ovary, fallopian tube and broad ligament: Secondary | ICD-10-CM

## 2017-05-03 ENCOUNTER — Ambulatory Visit
Admission: RE | Admit: 2017-05-03 | Discharge: 2017-05-03 | Disposition: A | Discharge status: Home / Self Care | Payer: 59 | Source: Ambulatory Visit | Attending: Family Medicine | Admitting: Family Medicine

## 2017-05-03 DIAGNOSIS — R9389 Abnormal findings on diagnostic imaging of other specified body structures: Secondary | ICD-10-CM

## 2017-05-03 DIAGNOSIS — N838 Other noninflammatory disorders of ovary, fallopian tube and broad ligament: Secondary | ICD-10-CM

## 2017-05-13 ENCOUNTER — Other Ambulatory Visit: Payer: Self-pay | Admitting: Family Medicine

## 2017-05-13 DIAGNOSIS — R935 Abnormal findings on diagnostic imaging of other abdominal regions, including retroperitoneum: Secondary | ICD-10-CM

## 2017-05-13 DIAGNOSIS — N838 Other noninflammatory disorders of ovary, fallopian tube and broad ligament: Secondary | ICD-10-CM

## 2017-05-24 ENCOUNTER — Ambulatory Visit
Admission: RE | Admit: 2017-05-24 | Discharge: 2017-05-24 | Disposition: A | Discharge status: Home / Self Care | Payer: 59 | Source: Ambulatory Visit | Attending: Family Medicine | Admitting: Family Medicine

## 2017-05-24 DIAGNOSIS — N838 Other noninflammatory disorders of ovary, fallopian tube and broad ligament: Secondary | ICD-10-CM

## 2017-05-24 DIAGNOSIS — R935 Abnormal findings on diagnostic imaging of other abdominal regions, including retroperitoneum: Secondary | ICD-10-CM

## 2017-05-24 MED ORDER — GADOBENATE DIMEGLUMINE 529 MG/ML IV SOLN
20.0000 mL | Freq: Once | INTRAVENOUS | Status: AC | PRN
  Administered 2017-05-24: 20 mL via INTRAVENOUS

## 2017-06-17 ENCOUNTER — Encounter: Payer: Self-pay | Admitting: Gastroenterology

## 2017-08-21 ENCOUNTER — Ambulatory Visit
Admission: RE | Admit: 2017-08-21 | Discharge: 2017-08-21 | Disposition: A | Discharge status: Home / Self Care | Payer: 59 | Source: Ambulatory Visit | Attending: Family Medicine | Admitting: Family Medicine

## 2017-08-21 ENCOUNTER — Other Ambulatory Visit: Payer: Self-pay | Admitting: Family Medicine

## 2017-08-21 DIAGNOSIS — R221 Localized swelling, mass and lump, neck: Secondary | ICD-10-CM

## 2017-08-21 MED ORDER — IOPAMIDOL (ISOVUE-300) INJECTION 61%
75.0000 mL | Freq: Once | INTRAVENOUS | Status: AC | PRN
  Administered 2017-08-21: 75 mL via INTRAVENOUS

## 2017-08-22 DIAGNOSIS — K1120 Sialoadenitis, unspecified: Secondary | ICD-10-CM | POA: Insufficient documentation

## 2017-09-05 DIAGNOSIS — J209 Acute bronchitis, unspecified: Secondary | ICD-10-CM | POA: Diagnosis not present

## 2017-10-09 DIAGNOSIS — G4733 Obstructive sleep apnea (adult) (pediatric): Secondary | ICD-10-CM | POA: Diagnosis not present

## 2017-10-30 DIAGNOSIS — J029 Acute pharyngitis, unspecified: Secondary | ICD-10-CM | POA: Diagnosis not present

## 2017-11-15 DIAGNOSIS — G4733 Obstructive sleep apnea (adult) (pediatric): Secondary | ICD-10-CM | POA: Diagnosis not present

## 2017-11-26 ENCOUNTER — Encounter: Payer: Self-pay | Admitting: Cardiology

## 2017-11-26 DIAGNOSIS — R002 Palpitations: Secondary | ICD-10-CM | POA: Diagnosis not present

## 2017-11-26 DIAGNOSIS — E119 Type 2 diabetes mellitus without complications: Secondary | ICD-10-CM | POA: Diagnosis not present

## 2017-11-26 DIAGNOSIS — E785 Hyperlipidemia, unspecified: Secondary | ICD-10-CM | POA: Diagnosis not present

## 2017-11-27 ENCOUNTER — Telehealth: Payer: Self-pay

## 2017-11-27 NOTE — Telephone Encounter (Signed)
SENT REFERRAL TO SCHEDULING 

## 2018-02-07 NOTE — Progress Notes (Signed)
Cardiology Office Note:    Date:  02/10/2018   ID:  [name], DOB 04/29/1961, MRN [id number]  PCP:  Maurice Small, MD  Cardiologist:  Buford Dresser, MD PhD  Referring MD: Maurice Small, MD   Chief Complaint  Patient presents with  . New Patient (Initial Visit)  . Chest Pain    History of Present Illness:    [name] is a 57 y.o. female with a hx of diabetes on insulin without complications, hypothyroidism, hyperlipidemia who is seen as a new consult at the rewuest of Dr. Justin Mend for evaluation and management of dyspnea and chest tightness.  Chest tightness: worse at night when lying down, associated with sharp left sided pain as well. Has been going on for three months, happens a few times a week. Lasts 10-15 minutes. Other than lying down, do other aggravating factors. No alleviating factors. No associated SOB, nausea, diaphoresis. Last night had one episode of chills/shivering followed by sweats, but otherwise not other symptoms. No syncope. Occasional palpitations, not related to tightness, not sure how often it comes and goes, barely noticeable. No edema. Unexpected weight loss, about 15-20 lbs in the last six months without trying. Poor appetite. Working with PCP on this. Has sleep apnea, uses CPAP. No PND or orthopnea, sleeps on 1-2 pillows stably.  Activity: walking, walks her dog. About 0.5-1 mile, stops when dog is tired. Climbs stairs without issue. Diet: poor appetite, watching carbs. Rare alcohol, no tobacco.  8-9 years ago had chest pain and was found to have lung inflammation vs URI vs costochondroitis.  FH: thinks her father might have had CHF near the end of his life, in his 21s. No MI or CVA in family.  ROS: recent kidney stone, also has fibroid.  Past Medical History:  Diagnosis Date  . Chronic kidney disease   . Diabetes mellitus without complication ([place])   . Hyperlipidemia   . Thyroid disease     Past Surgical History:  Procedure  Laterality Date  . jaw reduction     1984  . NASAL SEPTUM SURGERY  2000    Current Medications: Current Outpatient Medications on File Prior to Visit  Medication Sig  . aspirin 81 MG chewable tablet Chew 81 mg by mouth daily.  Marland Kitchen atorvastatin (LIPITOR) 20 MG tablet Take 20 mg by mouth daily.  . Cholecalciferol (VITAMIN D-3) 5000 UNITS TABS Take by mouth.  . fluticasone (FLONASE) 50 MCG/ACT nasal spray Place 1 spray into both nostrils daily.  . insulin detemir (LEVEMIR) 100 UNIT/ML injection Inject 38 Units into the skin daily.   Marland Kitchen levothyroxine (SYNTHROID, LEVOTHROID) 137 MCG tablet Take 137 mcg by mouth daily.  . metFORMIN (GLUCOPHAGE) 500 MG tablet Take 2,000 mg by mouth daily with breakfast.   . vitamin B-12 (CYANOCOBALAMIN) 1000 MCG tablet Take 1,000 mcg by mouth daily.   No current facility-administered medications on file prior to visit.      Allergies:   Septra [sulfamethoxazole-trimethoprim] and Sulfa antibiotics   Social History   Socioeconomic History  . Marital status: Divorced    Spouse name: Not on file  . Number of children: Not on file  . Years of education: Not on file  . Highest education level: Not on file  Occupational History  . Not on file  Social Needs  . Financial resource strain: Not on file  . Food insecurity:    Worry: Not on file    Inability: Not on file  . Transportation needs:  Medical: Not on file    Non-medical: Not on file  Tobacco Use  . Smoking status: Never Smoker  . Smokeless tobacco: Never Used  Substance and Sexual Activity  . Alcohol use: No  . Drug use: No  . Sexual activity: Not on file  Lifestyle  . Physical activity:    Days per week: Not on file    Minutes per session: Not on file  . Stress: Not on file  Relationships  . Social connections:    Talks on phone: Not on file    Gets together: Not on file    Attends religious service: Not on file    Active member of club or organization: Not on file    Attends  meetings of clubs or organizations: Not on file    Relationship status: Not on file  Other Topics Concern  . Not on file  Social History Narrative  . Not on file     Family History: The patient's family history includes Alcoholism in her father; Dementia in her father; Esophageal cancer in her father; High Cholesterol in her mother; High blood pressure in her mother; Lung cancer in her mother. thinks her father might have had CHF near the end of his life, in his 19s. No MI or CVA in family.  ROS:   Please see the history of present illness.  Additional pertinent ROS: Review of Systems  Constitutional: Positive for chills, diaphoresis, fever, malaise/fatigue and weight loss.  HENT: Negative for ear pain and hearing loss.   Eyes: Negative for double vision and pain.  Respiratory: Positive for sputum production. Negative for cough and shortness of breath.   Cardiovascular: Positive for chest pain and palpitations. Negative for orthopnea, claudication, leg swelling and PND.  Gastrointestinal: Positive for vomiting. Negative for abdominal pain, blood in stool, heartburn and melena.       Gagging/post eating occasional emesis from sensation of secretions in her throat.  Genitourinary: Negative for dysuria, flank pain and hematuria.  Musculoskeletal: Negative for falls and myalgias.  Skin: Negative for itching and rash.  Neurological: Negative for dizziness, focal weakness, loss of consciousness and headaches.  Endo/Heme/Allergies: Does not bruise/bleed easily.   EKGs/Labs/Other Studies Reviewed:    The following studies were reviewed today: prior imaging  EKG:  EKG is ordered today.  The ekg ordered today demonstrates normal sinus rhythm (borderline low voltage)  Recent Labs: No results found for requested labs within last 8760 hours.  Recent Lipid Panel    Component Value Date/Time   CHOL  11/23/2007 0410    176        ATP III CLASSIFICATION:  <200     mg/dL   Desirable  200-239   mg/dL   Borderline High  >=240    mg/dL   High   TRIG 255 (H) 11/23/2007 0410   HDL 26 (L) 11/23/2007 0410   CHOLHDL 6.8 11/23/2007 0410   VLDL 51 (H) 11/23/2007 0410   LDLCALC  11/23/2007 0410    99        Total Cholesterol/HDL:CHD Risk Coronary Heart Disease Risk Table                     Men   Women  1/2 Average Risk   3.4   3.3    Physical Exam:    VS:  BP 124/74   Pulse 76   Ht 5\' 4"  (1.626 m)   Wt 227 lb (103 kg)   BMI 38.96 kg/m  Wt Readings from Last 3 Encounters:  02/10/18 227 lb (103 kg)  09/25/15 240 lb (108.9 kg)  06/20/12 245 lb (111.1 kg)    GEN: Well nourished, well developed in no acute distress HEENT: Normal NECK: No JVD; No carotid bruits LYMPHATICS: No lymphadenopathy CARDIAC: regular rhythm, normal S1 and S2, no murmurs, rubs, gallops. Radial and DP pulses 2+ bilaterally. RESPIRATORY:  Clear to auscultation without rales, wheezing or rhonchi  ABDOMEN: Soft, non-tender, non-distended MUSCULOSKELETAL:  No edema; No deformity  SKIN: Warm and dry NEUROLOGIC:  Alert and oriented x 3 PSYCHIATRIC:  Normal affect   ASSESSMENT:    1. Precordial pain   2. Heart palpitations   3. Counseling on health promotion and disease prevention   4. Class 2 severe obesity due to excess calories with serious comorbidity and body mass index (BMI) of 39.0 to 39.9 in adult ([place])   5. Type 2 diabetes mellitus without complication, with long-term current use of insulin ([place])    PLAN:    1. Chest pain, palpitations. Sound atypical in nature, but given that she is a woman and has diabetes, she may not have typical symptoms. She can walk, and treadmill would be helpful in determining her exercise tolerance. Needs imaging stress given diabetes, will pursue nuclear. -treadmill nuclear study. -ASCVD 5.4% calculated today. Continue aspirin 81 mg and atorvastatin 20 mg.   Risk factors: -DM: well controlled on insulin and metformin. -nonsmoker -Obesity: unintentional weight  loss. See below. -counseled on lifestyles, discussed diet and exercise.   Her symptoms of loss of appetite, unintentional weight loss, gagging/postprandial emesis (she reports due to secretions), intermittent sweats and chills are concerning. She had a positive ANA during her workup in 2009 (1:820, speckled pattern). She is already working with her PCP on her symptoms, and she agrees to discuss GI/inflammatory workup with her doctor.  Plan for follow up: patient prefers to follow up PRN based on results of testing.  Medication Adjustments/Labs and Tests Ordered: Current medicines are reviewed at length with the patient today.  Concerns regarding medicines are outlined above.  Orders Placed This Encounter  Procedures  . Myocardial Perfusion Imaging  . EKG 12-Lead   No orders of the defined types were placed in this encounter.   Patient Instructions  Medication Instructions: Your physician recommends that you continue on your current medications as directed.    If you need a refill on your cardiac medications before your next appointment, please call your pharmacy.   Labwork: None  Procedures/Testing: Your physician has requested that you have en exercise stress myoview. For further information please visit [email or web address]. Please follow instruction sheet, as given. [facility]  Follow-Up: Your physician wants you to follow-up in as needed with Dr. Harrell Gave. Call our office at [phone] to schedule appointment.   Special Instructions:    Thank you for choosing [facility]care at [facility]!!       Signed, Buford Dresser, MD PhD 02/10/2018 10:46 AM    [place]

## 2018-02-10 ENCOUNTER — Encounter (INDEPENDENT_AMBULATORY_CARE_PROVIDER_SITE_OTHER): Payer: Self-pay

## 2018-02-10 ENCOUNTER — Encounter: Payer: Self-pay | Admitting: Cardiology

## 2018-02-10 ENCOUNTER — Ambulatory Visit (INDEPENDENT_AMBULATORY_CARE_PROVIDER_SITE_OTHER): Payer: 59 | Admitting: Cardiology

## 2018-02-10 VITALS — BP 124/74 | HR 76 | Ht 64.0 in | Wt 227.0 lb

## 2018-02-10 DIAGNOSIS — R072 Precordial pain: Secondary | ICD-10-CM | POA: Diagnosis not present

## 2018-02-10 DIAGNOSIS — E119 Type 2 diabetes mellitus without complications: Secondary | ICD-10-CM | POA: Diagnosis not present

## 2018-02-10 DIAGNOSIS — R002 Palpitations: Secondary | ICD-10-CM

## 2018-02-10 DIAGNOSIS — Z6839 Body mass index (BMI) 39.0-39.9, adult: Secondary | ICD-10-CM

## 2018-02-10 DIAGNOSIS — Z7189 Other specified counseling: Secondary | ICD-10-CM

## 2018-02-10 DIAGNOSIS — Z794 Long term (current) use of insulin: Secondary | ICD-10-CM | POA: Diagnosis not present

## 2018-02-10 NOTE — Patient Instructions (Signed)
Medication Instructions: Your physician recommends that you continue on your current medications as directed.    If you need a refill on your cardiac medications before your next appointment, please call your pharmacy.   Labwork: None  Procedures/Testing: Your physician has requested that you have en exercise stress myoview. For further information please visit [email or web address]. Please follow instruction sheet, as given. [facility]  Follow-Up: Your physician wants you to follow-up in as needed with Dr. Harrell Gave. Call our office at [phone] to schedule appointment.   Special Instructions:    Thank you for choosing Heartcare at [facility]!!

## 2018-02-13 ENCOUNTER — Telehealth (HOSPITAL_COMMUNITY): Payer: Self-pay

## 2018-02-13 NOTE — Telephone Encounter (Signed)
Encounter complete. 

## 2018-02-18 ENCOUNTER — Ambulatory Visit (HOSPITAL_COMMUNITY)
Admission: RE | Admit: 2018-02-18 | Discharge: 2018-02-18 | Disposition: A | Discharge status: Home / Self Care | Payer: 59 | Source: Ambulatory Visit | Attending: Internal Medicine | Admitting: Internal Medicine

## 2018-02-18 DIAGNOSIS — R072 Precordial pain: Secondary | ICD-10-CM | POA: Diagnosis not present

## 2018-02-18 DIAGNOSIS — R002 Palpitations: Secondary | ICD-10-CM | POA: Diagnosis not present

## 2018-02-18 MED ORDER — TECHNETIUM TC 99M TETROFOSMIN IV KIT
29.5000 | PACK | Freq: Once | INTRAVENOUS | Status: AC | PRN
  Administered 2018-02-18: 29.5 via INTRAVENOUS
  Filled 2018-02-18: qty 30

## 2018-02-19 ENCOUNTER — Ambulatory Visit (HOSPITAL_COMMUNITY)
Admission: RE | Admit: 2018-02-19 | Discharge: 2018-02-19 | Disposition: A | Discharge status: Home / Self Care | Payer: 59 | Source: Ambulatory Visit | Attending: Cardiology | Admitting: Cardiology

## 2018-02-19 DIAGNOSIS — G4733 Obstructive sleep apnea (adult) (pediatric): Secondary | ICD-10-CM | POA: Diagnosis not present

## 2018-02-19 LAB — MYOCARDIAL PERFUSION IMAGING
CHL CUP MPHR: 164 {beats}/min
CHL CUP RESTING HR STRESS: 62 {beats}/min
Estimated workload: 7.1 METS
Exercise duration (min): 6 min
Exercise duration (sec): 0 s
LV sys vol: 41 mL
LVDIAVOL: 87 mL (ref 46–106)
NUC STRESS TID: 0.83
Peak HR: 150 {beats}/min
Percent HR: 91 %
RPE: 19
SDS: 0
SRS: 4
SSS: 4

## 2018-02-19 MED ORDER — TECHNETIUM TC 99M SESTAMIBI GENERIC - CARDIOLITE
30.7000 | Freq: Once | INTRAVENOUS | Status: AC | PRN
  Administered 2018-02-19: 30.7 via INTRAVENOUS

## 2018-03-19 DIAGNOSIS — M542 Cervicalgia: Secondary | ICD-10-CM | POA: Diagnosis not present

## 2018-03-19 DIAGNOSIS — M25512 Pain in left shoulder: Secondary | ICD-10-CM | POA: Diagnosis not present

## 2018-03-26 DIAGNOSIS — S29012D Strain of muscle and tendon of back wall of thorax, subsequent encounter: Secondary | ICD-10-CM | POA: Diagnosis not present

## 2018-03-26 DIAGNOSIS — S161XXD Strain of muscle, fascia and tendon at neck level, subsequent encounter: Secondary | ICD-10-CM | POA: Diagnosis not present

## 2018-04-24 ENCOUNTER — Observation Stay (HOSPITAL_COMMUNITY)
Admission: EM | Admit: 2018-04-24 | Discharge: 2018-04-26 | Disposition: A | Discharge status: Home / Self Care | Payer: 59 | Attending: Internal Medicine | Admitting: Internal Medicine

## 2018-04-24 ENCOUNTER — Encounter (HOSPITAL_COMMUNITY): Payer: Self-pay | Admitting: *Deleted

## 2018-04-24 ENCOUNTER — Other Ambulatory Visit: Payer: Self-pay

## 2018-04-24 DIAGNOSIS — Z794 Long term (current) use of insulin: Secondary | ICD-10-CM | POA: Diagnosis not present

## 2018-04-24 DIAGNOSIS — Z7951 Long term (current) use of inhaled steroids: Secondary | ICD-10-CM | POA: Diagnosis not present

## 2018-04-24 DIAGNOSIS — Z9989 Dependence on other enabling machines and devices: Secondary | ICD-10-CM

## 2018-04-24 DIAGNOSIS — K921 Melena: Principal | ICD-10-CM | POA: Diagnosis present

## 2018-04-24 DIAGNOSIS — Z86010 Personal history of colonic polyps: Secondary | ICD-10-CM | POA: Insufficient documentation

## 2018-04-24 DIAGNOSIS — Z882 Allergy status to sulfonamides status: Secondary | ICD-10-CM | POA: Diagnosis not present

## 2018-04-24 DIAGNOSIS — Z8249 Family history of ischemic heart disease and other diseases of the circulatory system: Secondary | ICD-10-CM | POA: Diagnosis not present

## 2018-04-24 DIAGNOSIS — K5730 Diverticulosis of large intestine without perforation or abscess without bleeding: Secondary | ICD-10-CM | POA: Insufficient documentation

## 2018-04-24 DIAGNOSIS — E785 Hyperlipidemia, unspecified: Secondary | ICD-10-CM | POA: Diagnosis not present

## 2018-04-24 DIAGNOSIS — R509 Fever, unspecified: Secondary | ICD-10-CM | POA: Diagnosis not present

## 2018-04-24 DIAGNOSIS — N189 Chronic kidney disease, unspecified: Secondary | ICD-10-CM | POA: Diagnosis not present

## 2018-04-24 DIAGNOSIS — E039 Hypothyroidism, unspecified: Secondary | ICD-10-CM | POA: Diagnosis present

## 2018-04-24 DIAGNOSIS — Z79899 Other long term (current) drug therapy: Secondary | ICD-10-CM | POA: Diagnosis not present

## 2018-04-24 DIAGNOSIS — Z7982 Long term (current) use of aspirin: Secondary | ICD-10-CM | POA: Insufficient documentation

## 2018-04-24 DIAGNOSIS — Z79890 Hormone replacement therapy (postmenopausal): Secondary | ICD-10-CM | POA: Diagnosis not present

## 2018-04-24 DIAGNOSIS — Z23 Encounter for immunization: Secondary | ICD-10-CM | POA: Insufficient documentation

## 2018-04-24 DIAGNOSIS — Z791 Long term (current) use of non-steroidal anti-inflammatories (NSAID): Secondary | ICD-10-CM | POA: Diagnosis not present

## 2018-04-24 DIAGNOSIS — K922 Gastrointestinal hemorrhage, unspecified: Secondary | ICD-10-CM | POA: Diagnosis not present

## 2018-04-24 DIAGNOSIS — G4733 Obstructive sleep apnea (adult) (pediatric): Secondary | ICD-10-CM | POA: Diagnosis not present

## 2018-04-24 DIAGNOSIS — E1169 Type 2 diabetes mellitus with other specified complication: Secondary | ICD-10-CM | POA: Diagnosis present

## 2018-04-24 DIAGNOSIS — E1122 Type 2 diabetes mellitus with diabetic chronic kidney disease: Secondary | ICD-10-CM | POA: Diagnosis not present

## 2018-04-24 LAB — CBC
HCT: 40.7 % (ref 36.0–46.0)
Hemoglobin: 13.7 g/dL (ref 12.0–15.0)
MCH: 28.3 pg (ref 26.0–34.0)
MCHC: 33.7 g/dL (ref 30.0–36.0)
MCV: 84.1 fL (ref 78.0–100.0)
Platelets: 398 10*3/uL (ref 150–400)
RBC: 4.84 MIL/uL (ref 3.87–5.11)
RDW: 13.5 % (ref 11.5–15.5)
WBC: 12.1 10*3/uL — AB (ref 4.0–10.5)

## 2018-04-24 LAB — COMPREHENSIVE METABOLIC PANEL
ALT: 23 U/L (ref 0–44)
AST: 20 U/L (ref 15–41)
Albumin: 4.6 g/dL (ref 3.5–5.0)
Alkaline Phosphatase: 87 U/L (ref 38–126)
Anion gap: 11 (ref 5–15)
BILIRUBIN TOTAL: 0.5 mg/dL (ref 0.3–1.2)
BUN: 16 mg/dL (ref 6–20)
CHLORIDE: 103 mmol/L (ref 98–111)
CO2: 26 mmol/L (ref 22–32)
Calcium: 10.2 mg/dL (ref 8.9–10.3)
Creatinine, Ser: 0.81 mg/dL (ref 0.44–1.00)
Glucose, Bld: 128 mg/dL — ABNORMAL HIGH (ref 70–99)
POTASSIUM: 4.3 mmol/L (ref 3.5–5.1)
Sodium: 140 mmol/L (ref 135–145)
Total Protein: 8.2 g/dL — ABNORMAL HIGH (ref 6.5–8.1)

## 2018-04-24 NOTE — ED Notes (Signed)
[facility] called [facility] ED at 3:50pm today to say patient was on the way and would be approx 10 minutes.  Room was held, and released 2 hours later when patient failed to come to the ER. By 1800

## 2018-04-24 NOTE — ED Triage Notes (Signed)
Around 2pm pt had episode of bright red blood stool, lower abdominal discomfort. She did have another episode around 1600. No n/v. Denies any CP, dizziness or SOB at this time.

## 2018-04-25 ENCOUNTER — Telehealth: Payer: Self-pay

## 2018-04-25 DIAGNOSIS — Z9989 Dependence on other enabling machines and devices: Secondary | ICD-10-CM | POA: Diagnosis not present

## 2018-04-25 DIAGNOSIS — E039 Hypothyroidism, unspecified: Secondary | ICD-10-CM | POA: Diagnosis not present

## 2018-04-25 DIAGNOSIS — K922 Gastrointestinal hemorrhage, unspecified: Secondary | ICD-10-CM | POA: Diagnosis not present

## 2018-04-25 DIAGNOSIS — K921 Melena: Secondary | ICD-10-CM | POA: Diagnosis present

## 2018-04-25 DIAGNOSIS — E785 Hyperlipidemia, unspecified: Secondary | ICD-10-CM

## 2018-04-25 DIAGNOSIS — Z86010 Personal history of colonic polyps: Secondary | ICD-10-CM | POA: Diagnosis not present

## 2018-04-25 DIAGNOSIS — Z8719 Personal history of other diseases of the digestive system: Secondary | ICD-10-CM | POA: Diagnosis not present

## 2018-04-25 DIAGNOSIS — E1169 Type 2 diabetes mellitus with other specified complication: Secondary | ICD-10-CM | POA: Diagnosis not present

## 2018-04-25 DIAGNOSIS — G4733 Obstructive sleep apnea (adult) (pediatric): Secondary | ICD-10-CM | POA: Diagnosis not present

## 2018-04-25 LAB — HEMOGLOBIN
HEMOGLOBIN: 12.2 g/dL (ref 12.0–15.0)
Hemoglobin: 11.3 g/dL — ABNORMAL LOW (ref 12.0–15.0)

## 2018-04-25 LAB — PROTIME-INR
INR: 0.99
PROTHROMBIN TIME: 13 s (ref 11.4–15.2)

## 2018-04-25 LAB — CBC
HEMATOCRIT: 37.6 % (ref 36.0–46.0)
HEMOGLOBIN: 12.6 g/dL (ref 12.0–15.0)
MCH: 28.1 pg (ref 26.0–34.0)
MCHC: 33.5 g/dL (ref 30.0–36.0)
MCV: 83.9 fL (ref 78.0–100.0)
Platelets: 321 10*3/uL (ref 150–400)
RBC: 4.48 MIL/uL (ref 3.87–5.11)
RDW: 13.6 % (ref 11.5–15.5)
WBC: 9.4 10*3/uL (ref 4.0–10.5)

## 2018-04-25 LAB — APTT: APTT: 30 s (ref 24–36)

## 2018-04-25 LAB — TYPE AND SCREEN
ABO/RH(D): O POS
Antibody Screen: NEGATIVE

## 2018-04-25 LAB — BASIC METABOLIC PANEL
ANION GAP: 11 (ref 5–15)
BUN: 17 mg/dL (ref 6–20)
CO2: 22 mmol/L (ref 22–32)
Calcium: 9.3 mg/dL (ref 8.9–10.3)
Chloride: 105 mmol/L (ref 98–111)
Creatinine, Ser: 0.77 mg/dL (ref 0.44–1.00)
GFR calc non Af Amer: >60 mL/min (ref >60)
Glucose, Bld: 144 mg/dL — ABNORMAL HIGH (ref 70–99)
Potassium: 3.8 mmol/L (ref 3.5–5.1)
Sodium: 138 mmol/L (ref 135–145)

## 2018-04-25 LAB — POC OCCULT BLOOD, ED: Fecal Occult Bld: POSITIVE — AB

## 2018-04-25 LAB — GLUCOSE, CAPILLARY
GLUCOSE-CAPILLARY: 133 mg/dL — AB (ref 70–99)
GLUCOSE-CAPILLARY: 153 mg/dL — AB (ref 70–99)
Glucose-Capillary: 129 mg/dL — ABNORMAL HIGH (ref 70–99)
Glucose-Capillary: 102 mg/dL — ABNORMAL HIGH (ref 70–99)
Glucose-Capillary: 122 mg/dL — ABNORMAL HIGH (ref 70–99)

## 2018-04-25 LAB — ABO/RH: ABO/RH(D): O POS

## 2018-04-25 LAB — HIV ANTIBODY (ROUTINE TESTING W REFLEX): HIV SCREEN 4TH GENERATION: NONREACTIVE

## 2018-04-25 MED ORDER — INSULIN ASPART 100 UNIT/ML ~~LOC~~ SOLN
0.0000 [IU] | Freq: Three times a day (TID) | SUBCUTANEOUS | Status: DC
  Administered 2018-04-25: 2 [IU] via SUBCUTANEOUS
  Administered 2018-04-26: 1 [IU] via SUBCUTANEOUS

## 2018-04-25 MED ORDER — ACETAMINOPHEN 650 MG RE SUPP: 650.0000 mg | Freq: Four times a day (QID) | RECTAL | Status: DC | PRN

## 2018-04-25 MED ORDER — ONDANSETRON HCL 4 MG PO TABS: 4.0000 mg | ORAL_TABLET | Freq: Four times a day (QID) | ORAL | Status: DC | PRN

## 2018-04-25 MED ORDER — INFLUENZA VAC SPLIT QUAD 0.5 ML IM SUSY
0.5000 mL | PREFILLED_SYRINGE | INTRAMUSCULAR | Status: AC
  Administered 2018-04-26: 0.5 mL via INTRAMUSCULAR
  Filled 2018-04-25: qty 0.5

## 2018-04-25 MED ORDER — FAMOTIDINE IN NACL 20-0.9 MG/50ML-% IV SOLN
20.0000 mg | Freq: Once | INTRAVENOUS | Status: AC
  Administered 2018-04-25: 20 mg via INTRAVENOUS
  Filled 2018-04-25: qty 50

## 2018-04-25 MED ORDER — LEVOTHYROXINE SODIUM 25 MCG PO TABS
175.0000 ug | ORAL_TABLET | Freq: Every day | ORAL | Status: DC
  Administered 2018-04-25 – 2018-04-26 (×2): 175 ug via ORAL
  Filled 2018-04-25 (×2): qty 1

## 2018-04-25 MED ORDER — SODIUM CHLORIDE 0.9 % IV SOLN
INTRAVENOUS | Status: DC
  Administered 2018-04-25 – 2018-04-26 (×3): via INTRAVENOUS

## 2018-04-25 MED ORDER — ACETAMINOPHEN 325 MG PO TABS: 650.0000 mg | ORAL_TABLET | Freq: Four times a day (QID) | ORAL | Status: DC | PRN

## 2018-04-25 MED ORDER — ONDANSETRON HCL 4 MG/2ML IJ SOLN: 4.0000 mg | Freq: Four times a day (QID) | INTRAMUSCULAR | Status: DC | PRN

## 2018-04-25 MED ORDER — SODIUM CHLORIDE 0.9% IV SOLUTION
Freq: Once | INTRAVENOUS | Status: AC
  Administered 2018-04-25: 03:00:00 via INTRAVENOUS

## 2018-04-25 MED ORDER — ATORVASTATIN CALCIUM 10 MG PO TABS
20.0000 mg | ORAL_TABLET | Freq: Every day | ORAL | Status: DC
  Administered 2018-04-25 – 2018-04-26 (×2): 20 mg via ORAL
  Filled 2018-04-25 (×2): qty 2

## 2018-04-25 NOTE — H&P (Signed)
History and Physical    [name] MGQ:[id number] DOB: 07-09-1961 DOA: 04/24/2018  Referring MD/NP/PA: Antonietta Breach, PA-C PCP: Maurice Small, MD  Patient coming from: Home  Chief Complaint: Rectal bleeding  I have personally briefly reviewed patient's old medical records in [place]   HPI: [name] is a 58 y.o. female with medical history significant of DM type II, HLD, OSA on CPAP, and hypothyroidism; who presents with complaints of rectal bleeding starting around 1:30-2pm today.  Patient had a sudden urge to have a bowel movement and when she wiped noted a blood clot and in the toilet bowl was bright red blood.  Denies having any abdominal pain or recent constipation.  Patient went to urgent care around 4 PM due to symptoms, and had a subsequent bowel movement with bright red blood present.  Only recent change in includes that she had been on Mobic for the last 30 days for shoulder pain, but had not been using it daily here recently as symptoms were improved.  Patient also takes a daily aspirin.  Denies having any shortness of breath, chest pain, nausea, vomiting, headache, dizziness, loss of consciousness, or leg swelling.  Her blood sugars are normally less than 175 on average when she does check them, and her last hemoglobin A1c was noted to be around 7.51 to 2 months ago.  ED Course: Upon admission to the emergency department patient was noted to be afebrile, pulse 100-1 01, and all other vital signs stable.  Labs revealed WBC 12.1 and hemoglobin 13.7. Stool guaiacs were positive for blood.   Review of Systems  Constitutional: Negative for chills, fever and weight loss.  HENT: Negative for congestion and sinus pain.   Eyes: Negative for photophobia and pain.  Respiratory: Negative for cough and shortness of breath.   Cardiovascular: Negative for chest pain and leg swelling.  Gastrointestinal: Positive for blood in stool. Negative for abdominal pain, nausea and  vomiting.  Genitourinary: Negative for dysuria and hematuria.  Musculoskeletal: Negative for falls and neck pain.  Skin: Negative for itching.  Neurological: Negative for dizziness and loss of consciousness.  Psychiatric/Behavioral: Negative for substance abuse and suicidal ideas.    Past Medical History:  Diagnosis Date  . Chronic kidney disease   . Diabetes mellitus without complication ([place])   . Hyperlipidemia   . Thyroid disease     Past Surgical History:  Procedure Laterality Date  . jaw reduction     1984  . NASAL SEPTUM SURGERY  2000     reports that she has never smoked. She has never used smokeless tobacco. She reports that she does not drink alcohol or use drugs.  Allergies  Allergen Reactions  . Septra [Sulfamethoxazole-Trimethoprim] Itching and Rash  . Sulfa Antibiotics Hives    Family History  Problem Relation Age of Onset  . Lung cancer Mother   . High blood pressure Mother   . High Cholesterol Mother   . Esophageal cancer Father   . Dementia Father   . Alcoholism Father     Prior to Admission medications   Medication Sig Start Date End Date Taking? Authorizing Provider  aspirin 81 MG chewable tablet Chew 81 mg by mouth daily.   Yes [provider]  atorvastatin (LIPITOR) 20 MG tablet Take 20 mg by mouth daily.   Yes [provider]  Cholecalciferol (VITAMIN D-3) 5000 UNITS TABS Take 1 tablet by mouth daily.    Yes [provider]  fluticasone (FLONASE) 50  MCG/ACT nasal spray Place 1 spray into both nostrils daily.   Yes [provider]  insulin detemir (LEVEMIR) 100 UNIT/ML injection Inject 38 Units into the skin daily.    Yes [provider]  levothyroxine (SYNTHROID, LEVOTHROID) 175 MCG tablet Take 175 mcg by mouth daily before breakfast.   Yes [provider]  meloxicam (MOBIC) 15 MG tablet Take 15 mg by mouth daily.   Yes [provider]  metFORMIN (GLUCOPHAGE) 500 MG tablet Take 2,000  mg by mouth daily with breakfast.    Yes [provider]  vitamin B-12 (CYANOCOBALAMIN) 1000 MCG tablet Take 1,000 mcg by mouth daily.   Yes [provider]    Physical Exam:  Constitutional: NAD, calm, comfortable Vitals:   04/24/18 1941 04/24/18 2259  BP: 129/85 137/87  Pulse: 100 (!) 101  Resp: 15 18  Temp: 98.5 F (36.9 C) 98.5 F (36.9 C)  TempSrc: Oral Oral  SpO2: 98% 98%  Weight: 102.1 kg   Height: 5\' 4"  (1.626 m)    Eyes: PERRL, lids and conjunctivae normal ENMT: Mucous membranes are moist. Posterior pharynx clear of any exudate or lesions.Normal dentition.  Neck: normal, supple, no masses, no thyromegaly Respiratory: clear to auscultation bilaterally, no wheezing, no crackles. Normal respiratory effort. No accessory muscle use.  Cardiovascular: Regular rate and rhythm, no murmurs / rubs / gallops. No extremity edema. 2+ pedal pulses. No carotid bruits.  Abdomen: no tenderness, no masses palpated. No hepatosplenomegaly. Bowel sounds positive.  Musculoskeletal: no clubbing / cyanosis. No joint deformity upper and lower extremities. Good ROM, no contractures. Normal muscle tone.  Skin: no rashes, lesions, ulcers. No induration Neurologic: CN 2-12 grossly intact. Sensation intact, DTR normal. Strength 5/5 in all 4.  Psychiatric: Normal judgment and insight. Alert and oriented x 3. Normal mood.     Labs on Admission: I have personally reviewed following labs and imaging studies  CBC: Recent Labs  Lab 04/24/18 1953  WBC 12.1*  HGB 13.7  HCT 40.7  MCV 84.1  PLT 412   Basic Metabolic Panel: Recent Labs  Lab 04/24/18 1953  NA 140  K 4.3  CL 103  CO2 26  GLUCOSE 128*  BUN 16  CREATININE 0.81  CALCIUM 10.2   GFR: Estimated Creatinine Clearance: 90.2 mL/min (by C-G formula based on SCr of 0.81 mg/dL). Liver Function Tests: Recent Labs  Lab 04/24/18 1953  AST 20  ALT 23  ALKPHOS 87  BILITOT 0.5  PROT 8.2*  ALBUMIN 4.6   No results  for input(s): LIPASE, AMYLASE in the last 168 hours. No results for input(s): AMMONIA in the last 168 hours. Coagulation Profile: No results for input(s): INR, PROTIME in the last 168 hours. Cardiac Enzymes: No results for input(s): CKTOTAL, CKMB, CKMBINDEX, TROPONINI in the last 168 hours. BNP (last 3 results) No results for input(s): PROBNP in the last 8760 hours. HbA1C: No results for input(s): HGBA1C in the last 72 hours. CBG: No results for input(s): GLUCAP in the last 168 hours. Lipid Profile: No results for input(s): CHOL, HDL, LDLCALC, TRIG, CHOLHDL, LDLDIRECT in the last 72 hours. Thyroid Function Tests: No results for input(s): TSH, T4TOTAL, FREET4, T3FREE, THYROIDAB in the last 72 hours. Anemia Panel: No results for input(s): VITAMINB12, FOLATE, FERRITIN, TIBC, IRON, RETICCTPCT in the last 72 hours. Urine analysis:    Component Value Date/Time   COLORURINE YELLOW 11/22/2007 1845   APPEARANCEUR CLEAR 11/22/2007 1845   LABSPEC >1.046 (H) 11/22/2007 1845   PHURINE 5.0 11/22/2007  [place] 11/22/2007 1845   HGBUR NEGATIVE 11/22/2007 1845   BILIRUBINUR NEGATIVE 11/22/2007 1845   KETONESUR NEGATIVE 11/22/2007 1845   PROTEINUR NEGATIVE 11/22/2007 1845   UROBILINOGEN 0.2 11/22/2007 1845   NITRITE NEGATIVE 11/22/2007 1845   LEUKOCYTESUR  11/22/2007 1845    NEGATIVE MICROSCOPIC NOT DONE ON URINES WITH NEGATIVE PROTEIN, BLOOD, LEUKOCYTES, NITRITE, OR GLUCOSE <1000 mg/dL.   Sepsis Labs: No results found for this or any previous visit (from the past 240 hour(s)).   Radiological Exams on Admission: No results found.    Assessment/Plan Hematochezia: Acute.  Patient reports presents with reports of rectal bleeding.  Stool guaiacs positive.  Hemoglobin 13.7 and vital signs stable at this time. Colonoscopy by Dr. Deatra Ina of [facility] in 2013 noted sessile polyp removed and moderate sigmoid diverticulosis.  Patient on daily aspirin and Mobic, but suspect likely  lower GI bleed at this time.  - Admit to a MedSurg bed - Check PT/INR and APTT - Monitor intake and output - Clear liquid diet for now - Normal saline IV fluids at 75 mL/h - Hold aspirin and Mobic - Will need consult to gastroenterology in a.m.  Diabetes mellitus type 2: Patient reports relatively controlled blood sugars on metformin and Levemir 38 units daily.  Last hemoglobin A1c reported to be around 7.5  1-2 months ago. - Hypoglycemic protocol - Hold metformin and Levemir - CBGs q. before meals with sensitive SSI - Adjust insulin regimen as needed when placed back on a normal diet  Hypothyroidism - Continue the levothyroxine  Hyperlipidemia - Continue atorvastatin  OSA on CPAP - Continue home CPAP  DVT prophylaxis: SCDs Code Status: Full Family Communication: None Disposition Plan: Possible discharge home in 1 to 2 days if work-up negative Consults called: none Admission status: Observation  Norval Morton MD [facility] Pager [phone]   If 7PM-7AM, please contact night-coverage [email or web address] Password [email or web address]  04/25/2018, 2:02 AM

## 2018-04-25 NOTE — Telephone Encounter (Signed)
previsit scheduled for 05/05/18 at 8 am and colon with Dr Jerilynn Mages on 10/10 at 16 am will be notified at discharge.

## 2018-04-25 NOTE — Telephone Encounter (Signed)
Can be in [place]. Thanks-JLL

## 2018-04-25 NOTE — Telephone Encounter (Signed)
Does this need to be in the [facility] or hospital?

## 2018-04-25 NOTE — Progress Notes (Signed)
Patient has home CPAP  

## 2018-04-25 NOTE — ED Provider Notes (Signed)
[name]   CSN: [id number] Arrival date & time: 04/24/18  1911     History   Chief Complaint Chief Complaint  Patient presents with  . Blood In Stools    HPI [name] is a 57 y.o. female.   57 year old female with a history of diabetes, dyslipidemia, chronic kidney disease presents to the emergency department for evaluation of hematochezia.  Reports onset of hematochezia around 2 PM.  She had a subsequent episode at 1600 this evening.  Was seen at [facility] walk-in clinic who advised that she come to the emergency department.  They did appear to consult GI who felt presentation to the ED was ideal.  The patient has not had any additional bowel movements since 1600.  She denies any abdominal pain, nausea, vomiting, chest pain, shortness of breath, lightheadedness.  Denies any neck anticoagulation, but does take a baby aspirin daily.  Last colonoscopy was 6 years ago.  Per patient, she had a few polyps as well as diverticulosis.  She was supposed to have a follow-up colonoscopy within the past year, but was unable to afford follow-up at the time.  The history is provided by the patient. No language interpreter was used.    Past Medical History:  Diagnosis Date  . Chronic kidney disease   . Diabetes mellitus without complication ([place])   . Hyperlipidemia   . Thyroid disease     There are no active problems to display for this patient.   Past Surgical History:  Procedure Laterality Date  . jaw reduction     1984  . NASAL SEPTUM SURGERY  2000     OB History   None      Home Medications    Prior to Admission medications   Medication Sig Start Date End Date Taking? Authorizing Provider  aspirin 81 MG chewable tablet Chew 81 mg by mouth daily.   Yes [provider]  atorvastatin (LIPITOR) 20 MG tablet Take 20 mg by mouth daily.   Yes [provider]  Cholecalciferol (VITAMIN D-3) 5000 UNITS TABS  Take 1 tablet by mouth daily.    Yes [provider]  fluticasone (FLONASE) 50 MCG/ACT nasal spray Place 1 spray into both nostrils daily.   Yes [provider]  insulin detemir (LEVEMIR) 100 UNIT/ML injection Inject 38 Units into the skin daily.    Yes [provider]  levothyroxine (SYNTHROID, LEVOTHROID) 175 MCG tablet Take 175 mcg by mouth daily before breakfast.   Yes [provider]  meloxicam (MOBIC) 15 MG tablet Take 15 mg by mouth daily.   Yes [provider]  metFORMIN (GLUCOPHAGE) 500 MG tablet Take 2,000 mg by mouth daily with breakfast.    Yes [provider]  vitamin B-12 (CYANOCOBALAMIN) 1000 MCG tablet Take 1,000 mcg by mouth daily.   Yes [provider]    Family History Family History  Problem Relation Age of Onset  . Lung cancer Mother   . High blood pressure Mother   . High Cholesterol Mother   . Esophageal cancer Father   . Dementia Father   . Alcoholism Father     Social History Social History   Tobacco Use  . Smoking status: Never Smoker  . Smokeless tobacco: Never Used  Substance Use Topics  . Alcohol use: No  . Drug use: No     Allergies   Septra [sulfamethoxazole-trimethoprim] and Sulfa antibiotics   Review of Systems Review of Systems Ten  systems reviewed and are negative for acute change, except as noted in the HPI.    Physical Exam Updated Vital Signs BP 137/87 (BP Location: Left Arm)   Pulse (!) 101   Temp 98.5 F (36.9 C) (Oral)   Resp 18   Ht 5\' 4"  (1.626 m)   Wt 102.1 kg   SpO2 98%   BMI 38.62 kg/m   Physical Exam  Constitutional: She is oriented to person, place, and time. She appears well-developed and well-nourished. No distress.  Nontoxic and in NAD  HENT:  Head: Normocephalic and atraumatic.  Eyes: Conjunctivae and EOM are normal. No scleral icterus.  Neck: Normal range of motion.  Pulmonary/Chest: Effort normal. No respiratory distress.  Respirations even  and unlabored  Genitourinary:  Genitourinary Comments: +hematochezia on DRE. No hemorrhoid or anal fissure noted. Normal rectal tone.  Musculoskeletal: Normal range of motion.  Neurological: She is alert and oriented to person, place, and time. She exhibits normal muscle tone. Coordination normal.  Skin: Skin is warm and dry. No rash noted. She is not diaphoretic. No erythema. No pallor.  Psychiatric: She has a normal mood and affect. Her behavior is normal.  Nursing [name] and vitals reviewed.    ED Treatments / Results  Labs (all labs ordered are listed, but only abnormal results are displayed) Labs Reviewed  COMPREHENSIVE METABOLIC PANEL - Abnormal; Notable for the following components:      Result Value   Glucose, Bld 128 (*)    Total Protein 8.2 (*)    All other components within normal limits  CBC - Abnormal; Notable for the following components:   WBC 12.1 (*)    All other components within normal limits  POC OCCULT BLOOD, ED - Abnormal; Notable for the following components:   Fecal Occult Bld POSITIVE (*)    All other components within normal limits  TYPE AND SCREEN    EKG None  Radiology No results found.  Procedures Procedures (including critical care time)  Medications Ordered in ED Medications - No data to display   Initial Impression / Assessment and Plan / ED Course  I have reviewed the triage vital signs and the nursing notes.  Pertinent labs & imaging results that were available during my care of the patient were reviewed by me and considered in my medical decision making (see chart for details).     58 year old female presents to the emergency department for hematochezia which began at 1400 yesterday she has continued to experience bloody bowel movements and is Hemoccult positive on digital rectal exam.  Otherwise hemodynamically stable with reassuring hemoglobin.  No complaints of abdominal pain during my assessment despite documented lower abdominal  discomfort in triage.  No nausea, vomiting, fevers.  Plan for admission for serial CBCs; likely inpatient colonoscopy.  Patient denies the use of chronic anticoagulation.  She is agreeable to plan for admission.   Final Clinical Impressions(s) / ED Diagnoses   Final diagnoses:  Lower GI bleed    ED Discharge Orders    None       Antonietta Breach, Hershal Coria 04/25/18 0210    Palumbo, April, MD 04/25/18 [phone]

## 2018-04-25 NOTE — Consult Note (Signed)
Consultation  Referring Provider: Dr. Nevada Crane     Primary Care Physician:  Maurice Small, MD Primary Gastroenterologist: Dr. Deatra Ina        Reason for Consultation: Hematochezia            HPI:   [name] is a 57 y.o. female with a past medical history significant for diabetes, hyperlipidemia, OSA on CPAP and hypothyroidism, who presented to the ER on 04/24/2018 with a complaint of rectal bleeding starting around 130 to 2 PM.    Today, patient describes that she had a sudden urge to have a bowel movement around lunchtime yesterday and when she wiped, she saw a blood clot in the toilet bowl was bright red/maroon colored blood.  Went to urgent care around 4 PM due to symptoms and had a subsequent bowel movement with bright red blood.  No further bowel movements since. Does describe recent addition of Mobic over the last 3 weeks for some shoulder pain but had not been using it daily as the symptoms were improving.  Also takes a daily aspirin.      Denies abdominal pain, constipation, nausea, vomiting, heartburn, reflux, dizziness, palpitations or rectal pain.      ED Course: Upon admission to the emergency department patient was noted to be afebrile, pulse 100-1 01, and all other vital signs stable.  Labs revealed WBC 12.1 and hemoglobin 13.7. Stool guaiacs were positive for blood.   GI course: 06/20/2012 colonoscopy Dr. Deatra Ina: Sessile polyp, tubular adenoma at the hepatic flexure, moderate diverticulosis in the sigmoid colon, otherwise normal; recommendations were for repeat in 5 years  Past Medical History:  Diagnosis Date  . Chronic kidney disease   . Diabetes mellitus without complication ([place])   . Hyperlipidemia   . Thyroid disease     Past Surgical History:  Procedure Laterality Date  . jaw reduction     1984  . NASAL SEPTUM SURGERY  2000    Family History  Problem Relation Age of Onset  . Lung cancer Mother   . High blood pressure Mother   . High Cholesterol Mother    . Esophageal cancer Father   . Dementia Father   . Alcoholism Father     Social History   Tobacco Use  . Smoking status: Never Smoker  . Smokeless tobacco: Never Used  Substance Use Topics  . Alcohol use: No  . Drug use: No    Prior to Admission medications   Medication Sig Start Date End Date Taking? Authorizing Provider  aspirin 81 MG chewable tablet Chew 81 mg by mouth daily.   Yes [provider]  atorvastatin (LIPITOR) 20 MG tablet Take 20 mg by mouth daily.   Yes [provider]  Cholecalciferol (VITAMIN D-3) 5000 UNITS TABS Take 1 tablet by mouth daily.    Yes [provider]  fluticasone (FLONASE) 50 MCG/ACT nasal spray Place 1 spray into both nostrils daily.   Yes [provider]  insulin detemir (LEVEMIR) 100 UNIT/ML injection Inject 38 Units into the skin daily.    Yes [provider]  levothyroxine (SYNTHROID, LEVOTHROID) 175 MCG tablet Take 175 mcg by mouth daily before breakfast.   Yes [provider]  meloxicam (MOBIC) 15 MG tablet Take 15 mg by mouth daily.   Yes [provider]  metFORMIN (GLUCOPHAGE) 500 MG tablet Take 2,000 mg by mouth daily with breakfast.    Yes [provider]  vitamin B-12 (CYANOCOBALAMIN) 1000 MCG tablet Take  1,000 mcg by mouth daily.   Yes [provider]    Current Facility-Administered Medications  Medication Dose Route Frequency Provider Last Rate Last Dose  . 0.9 %  sodium chloride infusion   Intravenous Continuous Fuller Plan A, MD 75 mL/hr at 04/25/18 0319    . acetaminophen (TYLENOL) tablet 650 mg  650 mg Oral Q6H PRN Norval Morton, MD       Or  . acetaminophen (TYLENOL) suppository 650 mg  650 mg Rectal Q6H PRN Fuller Plan A, MD      . atorvastatin (LIPITOR) tablet 20 mg  20 mg Oral Daily Tamala Julian, Rondell A, MD      . Derrill Memo ON 04/26/2018] Influenza vac split quadrivalent PF (FLUARIX) injection 0.5 mL  0.5 mL Intramuscular Tomorrow-1000 Maurice Small, MD      . insulin aspart (novoLOG) injection 0-9 Units  0-9 Units Subcutaneous TID WC Smith, Rondell A, MD      . levothyroxine (SYNTHROID, LEVOTHROID) tablet 175 mcg  175 mcg Oral QAC breakfast Smith, Rondell A, MD      . ondansetron (ZOFRAN) tablet 4 mg  4 mg Oral Q6H PRN Fuller Plan A, MD       Or  . ondansetron (ZOFRAN) injection 4 mg  4 mg Intravenous Q6H PRN Norval Morton, MD        Allergies as of 04/24/2018 - Review Complete 02/18/2018  Allergen Reaction Noted  . Septra [sulfamethoxazole-trimethoprim] Itching and Rash 06/03/2012  . Sulfa antibiotics Hives 02/07/2018     Review of Systems:    Constitutional: No weight loss, fever or chills Skin: No rash  Cardiovascular: No chest pain, chest pressure or palpitations   Respiratory: No SOB Gastrointestinal: See HPI and otherwise negative Genitourinary: No dysuria  Neurological: No headache, dizziness or syncope Musculoskeletal: No new muscle or joint pain Hematologic: No bleeding Psychiatric: No history of depression or anxiety    Physical Exam:  Vital signs in last 24 hours: Temp:  [98.4 F (36.9 C)-98.5 F (36.9 C)] 98.4 F (36.9 C) (09/27 0321) Pulse Rate:  [91-101] 91 (09/27 0321) Resp:  [15-18] 18 (09/27 0321) BP: (126-137)/(85-98) 126/98 (09/27 0321) SpO2:  [97 %-98 %] 97 % (09/27 0321) Weight:  [102.1 kg] 102.1 kg (09/26 1941) Last BM Date: 04/24/18 General:   Pleasant Caucasian female appears to be in NAD, Well developed, Well nourished, alert and cooperative Head:  Normocephalic and atraumatic. Eyes:   PEERL, EOMI. No icterus. Conjunctiva pink. Ears:  Normal auditory acuity. Neck:  Supple Throat: Oral cavity and pharynx without inflammation, swelling or lesion.  Lungs: Respirations even and unlabored. Lungs clear to auscultation bilaterally.   No wheezes, crackles, or rhonchi.  Heart: Normal S1, S2. No MRG. Regular rate and rhythm. No peripheral edema, cyanosis or pallor.  Abdomen:  Soft,  nondistended, nontender. No rebound or guarding. Normal bowel sounds. No appreciable masses or hepatomegaly. Rectal:  Not performed.  Msk:  Symmetrical without gross deformities. Peripheral pulses intact.  Extremities:  Without edema, no deformity or joint abnormality. Normal ROM, normal sensation. Neurologic:  Alert and  oriented x4;  grossly normal neurologically.  Skin:   Dry and intact without significant lesions or rashes. Psychiatric: Demonstrates good judgement and reason without abnormal affect or behaviors.  LAB RESULTS: Recent Labs    04/24/18 1953 04/25/18 0558  WBC 12.1* 9.4  HGB 13.7 12.6  HCT 40.7 37.6  PLT 398 321   BMET Recent Labs    04/24/18 1953 04/25/18 0558  NA 140 138  K 4.3 3.8  CL 103 105  CO2 26 22  GLUCOSE 128* 144*  BUN 16 17  CREATININE 0.81 0.77  CALCIUM 10.2 9.3   LFT Recent Labs    04/24/18 1953  PROT 8.2*  ALBUMIN 4.6  AST 20  ALT 23  ALKPHOS 87  BILITOT 0.5   PT/INR Recent Labs    04/25/18 0259  LABPROT 13.0  INR 0.99    Impression / Plan:   Impression: 1.  Hematochezia: Rectal bleeding yesterday x2 , stool guaiac positive, hemoglobin 13.7-->12.6, colonoscopy in 2013 with a sessile tubular adenoma removed as well as moderate sigmoid diverticulosis; most likely diverticular bleed 2.  Diabetes type 2 3.  OSA on CPAP  Plan: 1.  Agree with holding Aspirin and Mobic 2.  Continue supportive measures with monitoring of hemoglobin with transfusion less than 7 3.  It seems as though patient symptoms have slowed already.  We will likely plan for outpatient colonoscopy in the next 1 to 2 weeks with Dr. Rush Landmark, pending any further acute bleeding in the next 24 hours  4.  Please await any further recommendations later today  Thank you for your kind consultation, we will continue to follow.  Lavone Nian [facility]  04/25/2018, 8:51 AM

## 2018-04-25 NOTE — ED Notes (Signed)
ED TO INPATIENT HANDOFF REPORT  Name/Age/Gender [name] 57 y.o. female  Code Status    Code Status Orders  (From admission, onward)         Start     Ordered   04/25/18 0216  Full code  Continuous     04/25/18 0217        Code Status History    This patient has a current code status but no historical code status.      Home/SNF/Other Home  Chief Complaint GI Bleed, Sent by UC  Level of Care/Admitting Diagnosis ED Disposition    ED Disposition Condition Comment   Admit  Hospital Area: [facility] [[id number]]  Level of Care: Med-Surg [16]  Diagnosis: Hematochezia [629528]  Admitting Physician: Norval Morton [[id number]]  Attending Physician: Norval Morton [[id number]]  PT Class (Do Not Modify): Observation [104]  PT Acc Code (Do Not Modify): Observation [10022]       Medical History Past Medical History:  Diagnosis Date  . Chronic kidney disease   . Diabetes mellitus without complication ([place])   . Hyperlipidemia   . Thyroid disease     Allergies Allergies  Allergen Reactions  . Septra [Sulfamethoxazole-Trimethoprim] Itching and Rash  . Sulfa Antibiotics Hives    IV Location/Drains/Wounds Patient Lines/Drains/Airways Status   Active Line/Drains/Airways    Name:   Placement date:   Placement time:   Site:   Days:   Peripheral IV 04/25/18 Left Forearm   04/25/18    0156    Forearm   less than 1          Labs/Imaging Results for orders placed or performed during the hospital encounter of 04/24/18 (from the past 48 hour(s))  Comprehensive metabolic panel     Status: Abnormal   Collection Time: 04/24/18  7:53 PM  Result Value Ref Range   Sodium 140 135 - 145 mmol/L   Potassium 4.3 3.5 - 5.1 mmol/L   Chloride 103 98 - 111 mmol/L   CO2 26 22 - 32 mmol/L   Glucose, Bld 128 (H) 70 - 99 mg/dL   BUN 16 6 - 20 mg/dL   Creatinine, Ser 0.81 0.44 - 1.00 mg/dL   Calcium 10.2 8.9 - 10.3 mg/dL   Total Protein 8.2 (H) 6.5 - 8.1  g/dL   Albumin 4.6 3.5 - 5.0 g/dL   AST 20 15 - 41 U/L   ALT 23 0 - 44 U/L   Alkaline Phosphatase 87 38 - 126 U/L   Total Bilirubin 0.5 0.3 - 1.2 mg/dL   GFR calc non Af Amer >60 >60 mL/min   GFR calc Af Amer >60 >60 mL/min    Comment: (NOTE) The eGFR has been calculated using the CKD EPI equation. This calculation has not been validated in all clinical situations. eGFR's persistently <60 mL/min signify possible Chronic Kidney Disease.    Anion gap 11 5 - 15    Comment: Performed at [facility], [place] [address], [place], [place] [address]  CBC     Status: Abnormal   Collection Time: 04/24/18  7:53 PM  Result Value Ref Range   WBC 12.1 (H) 4.0 - 10.5 K/uL   RBC 4.84 3.87 - 5.11 MIL/uL   Hemoglobin 13.7 12.0 - 15.0 g/dL   HCT 40.7 36.0 - 46.0 %   MCV 84.1 78.0 - 100.0 fL   MCH 28.3 26.0 - 34.0 pg   MCHC 33.7 30.0 - 36.0 g/dL  RDW 13.5 11.5 - 15.5 %   Platelets 398 150 - 400 K/uL    Comment: Performed at [facility], [place] [address] [place], [place] [address]  Type and screen [place]     Status: None   Collection Time: 04/24/18  7:53 PM  Result Value Ref Range   ABO/RH(D) O POS    Antibody Screen NEG    Sample Expiration      04/27/2018 Performed at [facility], [place] [address] [place], [place] [address]   POC occult blood, ED     Status: Abnormal   Collection Time: 04/25/18  1:45 AM  Result Value Ref Range   Fecal Occult Bld POSITIVE (A) NEGATIVE  Protime-INR     Status: None   Collection Time: 04/25/18  2:59 AM  Result Value Ref Range   Prothrombin Time 13.0 11.4 - 15.2 seconds   INR 0.99     Comment: Performed at [facility], [place] [address] [place], [place] [address]  APTT     Status: None   Collection Time: 04/25/18  2:59 AM  Result Value Ref Range   aPTT 30 24 - 36 seconds    Comment: Performed at [facility], [place] [address]  [place], [place] [address]   No results found.  Pending Labs Unresulted Labs (From admission, onward)    Start     Ordered   04/25/18 0500  HIV antibody (Routine Testing)  Tomorrow morning,   R     04/25/18 0217   04/25/18 0500  CBC  Tomorrow morning,   R     04/25/18 0217   04/25/18 2992  Basic metabolic panel  Tomorrow morning,   R     04/25/18 0217   04/24/18 1952  ABO/Rh  Once,   R     04/24/18 1952          Vitals/Pain Today's Vitals   04/24/18 1941 04/24/18 1945 04/24/18 2259 04/25/18 0321  BP: 129/85  137/87 (!) 126/98  Pulse: 100  (!) 101 91  Resp: 15  18 18   Temp: 98.5 F (36.9 C)  98.5 F (36.9 C) 98.4 F (36.9 C)  TempSrc: Oral  Oral Oral  SpO2: 98%  98% 97%  Weight: 102.1 kg     Height: 5' 4"  (1.626 m)     PainSc:  1   0-No pain    Isolation Precautions No active isolations  Medications Medications  atorvastatin (LIPITOR) tablet 20 mg (has no administration in time range)  levothyroxine (SYNTHROID, LEVOTHROID) tablet 175 mcg (has no administration in time range)  0.9 %  sodium chloride infusion ( Intravenous New Bag/Given 04/25/18 0319)  ondansetron (ZOFRAN) tablet 4 mg (has no administration in time range)    Or  ondansetron (ZOFRAN) injection 4 mg (has no administration in time range)  acetaminophen (TYLENOL) tablet 650 mg (has no administration in time range)    Or  acetaminophen (TYLENOL) suppository 650 mg (has no administration in time range)  insulin aspart (novoLOG) injection 0-9 Units (has no administration in time range)  famotidine (PEPCID) IVPB 20 mg premix (20 mg Intravenous New Bag/Given 04/25/18 0320)  0.9 %  sodium chloride infusion (Manually program via Guardrails IV Fluids) ( Intravenous New Bag/Given 04/25/18 0321)    Mobility walks

## 2018-04-25 NOTE — Progress Notes (Signed)
[name] is a 57 y.o. female with medical history significant of DM type II, HLD, OSA on CPAP, and hypothyroidism; who presents with complaints of rectal bleeding starting around 1:30-2pm today.  Patient had a sudden urge to have a bowel movement and when she wiped noted a blood clot and in the toilet bowl was bright red blood.  Denies having any abdominal pain or recent constipation.  Patient went to urgent care around 4 PM due to symptoms, and had a subsequent bowel movement with bright red blood present.  Only recent change in includes that she had been on Mobic for the last 30 days for shoulder pain, but had not been using it daily here recently as symptoms were improved. Patient also takes a daily aspirin.   04/25/18: Seen and examined at her bedside. Rectal bleeding stopped. Seen by GI this am. Appreciate recs.   Please refer to H&P dictated by Dr Tamala Julian on 04/25/18 for further details of the assessment and plan.

## 2018-04-25 NOTE — Telephone Encounter (Signed)
-----   Message from Levin Erp, [place] sent at 04/25/2018 10:09 AM EDT ----- Regarding: Needs colon outpatient Please schedule for colonoscopy with Dr. Rush Landmark in the next 1-2 wks. He says he has availability in a couple weeks.  Thanks-JLL

## 2018-04-26 DIAGNOSIS — K921 Melena: Secondary | ICD-10-CM | POA: Diagnosis not present

## 2018-04-26 LAB — CBC
HEMATOCRIT: 34.2 % — AB (ref 36.0–46.0)
Hemoglobin: 11.3 g/dL — ABNORMAL LOW (ref 12.0–15.0)
MCH: 28.4 pg (ref 26.0–34.0)
MCHC: 33 g/dL (ref 30.0–36.0)
MCV: 85.9 fL (ref 78.0–100.0)
PLATELETS: 275 10*3/uL (ref 150–400)
RBC: 3.98 MIL/uL (ref 3.87–5.11)
RDW: 13.8 % (ref 11.5–15.5)
WBC: 6 10*3/uL (ref 4.0–10.5)

## 2018-04-26 LAB — GLUCOSE, CAPILLARY: Glucose-Capillary: 131 mg/dL — ABNORMAL HIGH (ref 70–99)

## 2018-04-26 MED ORDER — ACETAMINOPHEN 325 MG PO TABS: 650.0000 mg | ORAL_TABLET | Freq: Four times a day (QID) | ORAL | Status: AC | PRN

## 2018-04-26 NOTE — Discharge Summary (Signed)
Physician Discharge Summary  [name] ZJQ:[id number] DOB: 14-Apr-1961 DOA: 04/24/2018  PCP: Maurice Small, MD  Admit date: 04/24/2018 Discharge date: 04/26/2018  Admitted From:home Disposition:  home  Recommendations for Outpatient Follow-up:  1. Follow up with PCP in 1-2 weeks 2. Please obtain BMP/CBC in one week 3. Follow up with dr Rush Landmark  Home Health none Equipment/Devices none  Discharge Condition:stable CODE STATUS:full Diet recommendation: cardiac  Brief/Interim Summary:57 y.o. female with medical history significant of DM type II, HLD, OSA on CPAP, and hypothyroidism; who presents with complaints of rectal bleeding starting around 1:30-2pm today.  Patient had a sudden urge to have a bowel movement and when she wiped noted a blood clot and in the toilet bowl was bright red blood.  Denies having any abdominal pain or recent constipation.  Patient went to urgent care around 4 PM due to symptoms, and had a subsequent bowel movement with bright red blood present.  Only recent change in includes that she had been on Mobic for the last 30 days for shoulder pain, but had not been using it daily here recently as symptoms were improved.  Patient also takes a daily aspirin.  Denies having any shortness of breath, chest pain, nausea, vomiting, headache, dizziness, loss of consciousness, or leg swelling.  Her blood sugars are normally less than 175 on average when she does check them, and her last hemoglobin A1c was noted to be around 7.51 to 2 months ago.  ED Course: Upon admission to the emergency department patient was noted to be afebrile, pulse 100-1 01, and all other vital signs stable.  Labs revealed WBC 12.1 and hemoglobin 13.7. Stool guaiacs were positive for blood.   Discharge Diagnoses:  Principal Problem:   Hematochezia Active Problems:   Type 2 diabetes mellitus with hyperlipidemia (HCC)   Hypothyroidism   OSA on CPAP  1] hematochezia patient presented with  symptoms of rectal bleeding.  She was guaiac positive.  Her hemoglobin dropped from 13.7-12.6.  She was seen in consultation by GI Dr. Rush Landmark.  She remained stable during this hospital stay.  She did not have any further bowel movements which was bloody.  And her hemoglobin remained stable.  She already has a scheduled appointment with Dr. Rush Landmark 05/08/2018 for possible colonoscopy.  He has recommended to stop her Mobic and aspirin which has been stopped at this time.  Consider restarting aspirin as an outpatient  once  GI is cleared.  He is anxious to go home.  2.  History of diabetes restart home dose of insulin.  3.  Obstructive sleep apnea continue CPAP.  4.  History of hypothyroidism continue Synthroid.  5.  Hyperlipidemia continue statin.  Discharge Instructions  Discharge Instructions    Call MD for:  difficulty breathing, headache or visual disturbances   Complete by:  As directed    Call MD for:  persistant dizziness or light-headedness   Complete by:  As directed    Call MD for:  persistant nausea and vomiting   Complete by:  As directed    Call MD for:  redness, tenderness, or signs of infection (pain, swelling, redness, odor or green/yellow discharge around incision site)   Complete by:  As directed    Call MD for:  severe uncontrolled pain   Complete by:  As directed    Diet - low sodium heart healthy   Complete by:  As directed    Increase activity slowly   Complete by:  As directed  Allergies as of 04/26/2018      Reactions   Septra [sulfamethoxazole-trimethoprim] Itching, Rash   Sulfa Antibiotics Hives      Medication List    STOP taking these medications   aspirin 81 MG chewable tablet   meloxicam 15 MG tablet Commonly known as:  MOBIC     TAKE these medications   acetaminophen 325 MG tablet Commonly known as:  TYLENOL Take 2 tablets (650 mg total) by mouth every 6 (six) hours as needed for mild pain (or Fever >/= 101).   atorvastatin 20 MG  tablet Commonly known as:  LIPITOR Take 20 mg by mouth daily.   FLONASE 50 MCG/ACT nasal spray Generic drug:  fluticasone Place 1 spray into both nostrils daily.   insulin detemir 100 UNIT/ML injection Commonly known as:  LEVEMIR Inject 38 Units into the skin daily.   levothyroxine 175 MCG tablet Commonly known as:  SYNTHROID, LEVOTHROID Take 175 mcg by mouth daily before breakfast.   metFORMIN 500 MG tablet Commonly known as:  GLUCOPHAGE Take 2,000 mg by mouth daily with breakfast.   vitamin B-12 1000 MCG tablet Commonly known as:  CYANOCOBALAMIN Take 1,000 mcg by mouth daily.   Vitamin D-3 5000 units Tabs Take 1 tablet by mouth daily.      Follow-up Information    Maurice Small, MD Follow up.   Specialty:  Family Medicine Contact information: [place] [address] [phone]        Buford Dresser, MD .   Specialty:  Cardiology Contact information: [address] [place] [address] [phone]        Mansouraty, Telford Nab., MD Follow up.   Specialties:  Gastroenterology, Internal Medicine Contact information: [place] [address] [phone]          Allergies  Allergen Reactions  . Septra [Sulfamethoxazole-Trimethoprim] Itching and Rash  . Sulfa Antibiotics Hives    Consultations:  GI    Procedures/Studies:  No results found. (Echo, Carotid, EGD, Colonoscopy, ERCP)    Subjective:   Discharge Exam: Vitals:   04/25/18 2013 04/26/18 0430  BP: (!) 134/50 (!) 113/59  Pulse: 60 (!) 57  Resp: 20 18  Temp: 98.2 F (36.8 C) 98.2 F (36.8 C)  SpO2: 97% 96%   Vitals:   04/25/18 0321 04/25/18 1355 04/25/18 2013 04/26/18 0430  BP: (!) 126/98 112/73 (!) 134/50 (!) 113/59  Pulse: 91 70 60 (!) 57  Resp: 18 14 20 18   Temp: 98.4 F (36.9 C) 98.4 F (36.9 C) 98.2 F (36.8 C) 98.2 F (36.8 C)  TempSrc: Oral Oral Oral Oral  SpO2: 97% 97% 97% 96%  Weight:       Height:        General: Pt is alert, awake, not in acute distress Cardiovascular: RRR, S1/S2 +, no rubs, no gallops Respiratory: CTA bilaterally, no wheezing, no rhonchi Abdominal: Soft, NT, ND, bowel sounds + Extremities: no edema, no cyanosis    The results of significant diagnostics from this hospitalization (including imaging, microbiology, ancillary and laboratory) are listed below for reference.     Microbiology: No results found for this or any previous visit (from the past 240 hour(s)).   Labs: BNP (last 3 results) No results for input(s): BNP in the last 8760 hours. Basic Metabolic Panel: Recent Labs  Lab 04/24/18 1953 04/25/18 0558  NA 140 138  K 4.3 3.8  CL 103 105  CO2 26 22  GLUCOSE 128*  144*  BUN 16 17  CREATININE 0.81 0.77  CALCIUM 10.2 9.3   Liver Function Tests: Recent Labs  Lab 04/24/18 1953  AST 20  ALT 23  ALKPHOS 87  BILITOT 0.5  PROT 8.2*  ALBUMIN 4.6   No results for input(s): LIPASE, AMYLASE in the last 168 hours. No results for input(s): AMMONIA in the last 168 hours. CBC: Recent Labs  Lab 04/24/18 1953 04/25/18 0558 04/25/18 1105 04/25/18 2235 04/26/18 0512  WBC 12.1* 9.4  --   --  6.0  HGB 13.7 12.6 12.2 11.3* 11.3*  HCT 40.7 37.6  --   --  34.2*  MCV 84.1 83.9  --   --  85.9  PLT 398 321  --   --  275   Cardiac Enzymes: No results for input(s): CKTOTAL, CKMB, CKMBINDEX, TROPONINI in the last 168 hours. BNP: Invalid input(s): POCBNP CBG: Recent Labs  Lab 04/25/18 0804 04/25/18 1138 04/25/18 1616 04/25/18 2009 04/26/18 0759  GLUCAP 153* 129* 102* 122* 131*   D-Dimer No results for input(s): DDIMER in the last 72 hours. Hgb A1c No results for input(s): HGBA1C in the last 72 hours. Lipid Profile No results for input(s): CHOL, HDL, LDLCALC, TRIG, CHOLHDL, LDLDIRECT in the last 72 hours. Thyroid function studies No results for input(s): TSH, T4TOTAL, T3FREE, THYROIDAB in the last 72 hours.  Invalid input(s):  FREET3 Anemia work up No results for input(s): VITAMINB12, FOLATE, FERRITIN, TIBC, IRON, RETICCTPCT in the last 72 hours. Urinalysis    Component Value Date/Time   COLORURINE YELLOW 11/22/2007 1845   APPEARANCEUR CLEAR 11/22/2007 1845   LABSPEC >1.046 (H) 11/22/2007 1845   PHURINE 5.0 11/22/2007 1845   GLUCOSEU NEGATIVE 11/22/2007 1845   HGBUR NEGATIVE 11/22/2007 1845   BILIRUBINUR NEGATIVE 11/22/2007 1845   KETONESUR NEGATIVE 11/22/2007 1845   PROTEINUR NEGATIVE 11/22/2007 1845   UROBILINOGEN 0.2 11/22/2007 1845   NITRITE NEGATIVE 11/22/2007 1845   LEUKOCYTESUR  11/22/2007 1845    NEGATIVE MICROSCOPIC NOT DONE ON URINES WITH NEGATIVE PROTEIN, BLOOD, LEUKOCYTES, NITRITE, OR GLUCOSE <1000 mg/dL.   Sepsis Labs Invalid input(s): PROCALCITONIN,  WBC,  LACTICIDVEN Microbiology No results found for this or any previous visit (from the past 240 hour(s)).   Time coordinating discharge: 34 minutes  SIGNED:   Georgette Shell, MD  [facility] 04/26/2018, 9:41 AM Pager   If 7PM-7AM, please contact night-coverage [email or web address] Password [email or web address]

## 2018-04-26 NOTE — Progress Notes (Signed)
Patient is discharged home. Discharge instructions were given to patient

## 2018-04-28 ENCOUNTER — Encounter: Payer: Self-pay | Admitting: Gastroenterology

## 2018-04-30 DIAGNOSIS — Z5181 Encounter for therapeutic drug level monitoring: Secondary | ICD-10-CM | POA: Diagnosis not present

## 2018-04-30 DIAGNOSIS — Z09 Encounter for follow-up examination after completed treatment for conditions other than malignant neoplasm: Secondary | ICD-10-CM | POA: Diagnosis not present

## 2018-04-30 DIAGNOSIS — K625 Hemorrhage of anus and rectum: Secondary | ICD-10-CM | POA: Diagnosis not present

## 2018-05-05 ENCOUNTER — Encounter: Payer: Self-pay | Admitting: Gastroenterology

## 2018-05-05 ENCOUNTER — Ambulatory Visit (AMBULATORY_SURGERY_CENTER): Payer: Self-pay

## 2018-05-05 VITALS — Ht 64.0 in | Wt 226.0 lb

## 2018-05-05 DIAGNOSIS — K625 Hemorrhage of anus and rectum: Secondary | ICD-10-CM

## 2018-05-05 DIAGNOSIS — Z86010 Personal history of colonic polyps: Secondary | ICD-10-CM

## 2018-05-05 MED ORDER — PEG 3350-KCL-NA BICARB-NACL 420 G PO SOLR: 4000.0000 mL | Freq: Once | ORAL | 0 refills | Status: AC

## 2018-05-05 NOTE — Progress Notes (Signed)
Per pt, no allergies to soy or egg products.Pt not taking any weight loss meds or using  O2 at home.  Pt refused emmi video. 

## 2018-05-08 ENCOUNTER — Encounter: Payer: Self-pay | Admitting: Gastroenterology

## 2018-05-08 ENCOUNTER — Ambulatory Visit (AMBULATORY_SURGERY_CENTER): Payer: 59 | Admitting: Gastroenterology

## 2018-05-08 VITALS — BP 131/68 | HR 63 | Temp 98.9°F | Resp 16 | Ht 64.0 in | Wt 227.0 lb

## 2018-05-08 DIAGNOSIS — K625 Hemorrhage of anus and rectum: Secondary | ICD-10-CM | POA: Diagnosis not present

## 2018-05-08 DIAGNOSIS — K5730 Diverticulosis of large intestine without perforation or abscess without bleeding: Secondary | ICD-10-CM | POA: Diagnosis not present

## 2018-05-08 DIAGNOSIS — K640 First degree hemorrhoids: Secondary | ICD-10-CM | POA: Diagnosis not present

## 2018-05-08 DIAGNOSIS — Z1211 Encounter for screening for malignant neoplasm of colon: Secondary | ICD-10-CM | POA: Diagnosis not present

## 2018-05-08 MED ORDER — SODIUM CHLORIDE 0.9 % IV SOLN: 500.0000 mL | Freq: Once | INTRAVENOUS | Status: DC

## 2018-05-08 NOTE — Progress Notes (Signed)
Pt's states no medical or surgical changes since previsit or office visit. 

## 2018-05-08 NOTE — Patient Instructions (Signed)
Please read handouts provided. High Fiber diet. Continue present medications.    YOU HAD AN ENDOSCOPIC PROCEDURE TODAY AT [facility]:   Refer to the procedure report that was given to you for any specific questions about what was found during the examination.  If the procedure report does not answer your questions, please call your gastroenterologist to clarify.  If you requested that your care partner not be given the details of your procedure findings, then the procedure report has been included in a sealed envelope for you to review at your convenience later.  YOU SHOULD EXPECT: Some feelings of bloating in the abdomen. Passage of more gas than usual.  Walking can help get rid of the air that was put into your GI tract during the procedure and reduce the bloating. If you had a lower endoscopy (such as a colonoscopy or flexible sigmoidoscopy) you may notice spotting of blood in your stool or on the toilet paper. If you underwent a bowel prep for your procedure, you may not have a normal bowel movement for a few days.  Please Note:  You might notice some irritation and congestion in your nose or some drainage.  This is from the oxygen used during your procedure.  There is no need for concern and it should clear up in a day or so.  SYMPTOMS TO REPORT IMMEDIATELY:   Following lower endoscopy (colonoscopy or flexible sigmoidoscopy):  Excessive amounts of blood in the stool  Significant tenderness or worsening of abdominal pains  Swelling of the abdomen that is new, acute  Fever of 100F or higher   For urgent or emergent issues, a gastroenterologist can be reached at any hour by calling [phone].   DIET:  We do recommend a small meal at first, but then you may proceed to your regular diet.  Drink plenty of fluids but you should avoid alcoholic beverages for 24 hours.  ACTIVITY:  You should plan to take it easy for the rest of today and you should NOT DRIVE or use  heavy machinery until tomorrow (because of the sedation medicines used during the test).    FOLLOW UP: Our staff will call the number listed on your records the next business day following your procedure to check on you and address any questions or concerns that you may have regarding the information given to you following your procedure. If we do not reach you, we will leave a message.  However, if you are feeling well and you are not experiencing any problems, there is no need to return our call.  We will assume that you have returned to your regular daily activities without incident.  If any biopsies were taken you will be contacted by phone or by letter within the next 1-3 weeks.  Please call us at [phone] if you have not heard about the biopsies in 3 weeks.    SIGNATURES/CONFIDENTIALITY: You and/or your care partner have signed paperwork which will be entered into your electronic medical record.  These signatures attest to the fact that that the information above on your After Visit Summary has been reviewed and is understood.  Full responsibility of the confidentiality of this discharge information lies with you and/or your care-partner.

## 2018-05-08 NOTE — Progress Notes (Signed)
A and O x3. Report to RN. Tolerated MAC anesthesia well.

## 2018-05-08 NOTE — Op Note (Signed)
[place] Patient Name: [name] Procedure Date: 05/08/2018 9:27 AM MRN: [id number] Endoscopist: Justice Britain , MD Age: 57 Referring MD:  Date of Birth: 03-06-1961 Gender: Female Account #: [id number] Procedure:                Colonoscopy Indications:              High risk colon cancer surveillance: Personal                            history of colonic polyps, Incidental -                            Hematochezia, Personal history of digestive disease Medicines:                Monitored Anesthesia Care Procedure:                Pre-Anesthesia Assessment:                           - Prior to the procedure, a History and Physical                            was performed, and patient medications and                            allergies were reviewed. The patient's tolerance of                            previous anesthesia was also reviewed. The risks                            and benefits of the procedure and the sedation                            options and risks were discussed with the patient.                            All questions were answered, and informed consent                            was obtained. Prior Anticoagulants: The patient has                            taken no previous anticoagulant or antiplatelet                            agents. ASA Grade Assessment: III - A patient with                            severe systemic disease. After reviewing the risks                            and benefits, the patient was deemed in  satisfactory condition to undergo the procedure.                           After obtaining informed consent, the colonoscope                            was passed under direct vision. Throughout the                            procedure, the patient's blood pressure, pulse, and                            oxygen saturations were monitored continuously. The   Colonoscope was introduced through the anus and                            advanced to the the cecum, identified by                            appendiceal orifice and ileocecal valve. The                            colonoscopy was performed without difficulty. The                            patient tolerated the procedure. The quality of the                            bowel preparation was evaluated using the BBPS                            [facility] Bowel Preparation Scale) with scores of:                            Right Colon = 3 (entire mucosa seen well with no                            residual staining, small fragments of stool or                            opaque liquid), Transverse Colon = 3 (entire mucosa                            seen well with no residual staining, small                            fragments of stool or opaque liquid) and Left Colon                            = 2 (minor amount of residual staining, small                            fragments of stool and/or opaque liquid, but mucosa  seen well). The total BBPS score equals 8. The                            quality of the bowel preparation was good. Scope In: 9:36:51 AM Scope Out: 9:54:49 AM Scope Withdrawal Time: 0 hours 12 minutes 26 seconds  Total Procedure Duration: 0 hours 17 minutes 58 seconds  Findings:                 The digital rectal exam findings include                            non-thrombosed external hemorrhoids. Pertinent                            negatives include no palpable rectal lesions.                           Many small and large-mouthed diverticula were found                            in the recto-sigmoid colon, sigmoid colon,                            descending colon, transverse colon and ascending                            colon. The predominance of diverticulosis however                            is in the DC/[place]/RS portions of the colon.                            Normal mucosa was found in the entire colon                            otherwise.                           Non-bleeding non-thrombosed external and internal                            hemorrhoids were found during retroflexion. The                            hemorrhoids were Grade I (internal hemorrhoids that                            do not prolapse). Complications:            No immediate complications. Estimated Blood Loss:     Estimated blood loss: none. Impression:               - Non-thrombosed external hemorrhoids found on                            digital rectal exam.                           -  Diverticulosis in the recto-sigmoid colon, in the                            sigmoid colon, in the descending colon, in the                            transverse colon and in the ascending colon.                           - Normal mucosa in the entire examined colon                            otherwise.                           - Non-bleeding non-thrombosed external and internal                            hemorrhoids. Recommendation:           - The patient will be observed post-procedure,                            until all discharge criteria are met.                           - Discharge patient to home.                           - Patient has a contact number available for                            emergencies. The signs and symptoms of potential                            delayed complications were discussed with the                            patient. Return to normal activities tomorrow.                            Written discharge instructions were provided to the                            patient.                           - High fiber diet.                           - Continue present medications.                           - Repeat colonoscopy in 5 years for surveillance                            due to prior  history of adenomatous polyps even                             though none seen on this examination. If negative                            on next examination, would consider restarting                            patient on Average-Risk screening, based on current                            guidelines.                           - The findings and recommendations were discussed                            with the patient.                           - The findings and recommendations were discussed                            with the designated responsible adult. Justice Britain, MD 05/08/2018 10:02:06 AM

## 2018-05-09 ENCOUNTER — Telehealth: Payer: Self-pay

## 2018-05-09 NOTE — Telephone Encounter (Signed)
  Follow up Call-  Call back number 05/08/2018  Post procedure Call Back phone  # [phone]  Permission to leave phone message Yes  Some recent data might be hidden     Patient questions:  Do you have a fever, pain , or abdominal swelling? No. Pain Score  0 *  Have you tolerated food without any problems? Yes.    Have you been able to return to your normal activities? Yes.    Do you have any questions about your discharge instructions: Diet   No. Medications  No. Follow up visit  No.  Do you have questions or concerns about your Care? No.  Actions: * If pain score is 4 or above: No action needed, pain <4.

## 2018-05-09 NOTE — Telephone Encounter (Signed)
Left message

## 2018-05-27 DIAGNOSIS — G4733 Obstructive sleep apnea (adult) (pediatric): Secondary | ICD-10-CM | POA: Diagnosis not present

## 2018-06-04 DIAGNOSIS — Z0000 Encounter for general adult medical examination without abnormal findings: Secondary | ICD-10-CM | POA: Diagnosis not present

## 2018-06-04 DIAGNOSIS — E039 Hypothyroidism, unspecified: Secondary | ICD-10-CM | POA: Diagnosis not present

## 2018-06-04 DIAGNOSIS — E1165 Type 2 diabetes mellitus with hyperglycemia: Secondary | ICD-10-CM | POA: Diagnosis not present

## 2018-06-04 DIAGNOSIS — E785 Hyperlipidemia, unspecified: Secondary | ICD-10-CM | POA: Diagnosis not present

## 2018-06-04 DIAGNOSIS — Z794 Long term (current) use of insulin: Secondary | ICD-10-CM | POA: Diagnosis not present

## 2018-07-28 DIAGNOSIS — Z1231 Encounter for screening mammogram for malignant neoplasm of breast: Secondary | ICD-10-CM | POA: Diagnosis not present

## 2018-08-08 DIAGNOSIS — Z01419 Encounter for gynecological examination (general) (routine) without abnormal findings: Secondary | ICD-10-CM | POA: Diagnosis not present

## 2018-08-08 DIAGNOSIS — R69 Illness, unspecified: Secondary | ICD-10-CM | POA: Diagnosis not present

## 2018-08-08 DIAGNOSIS — Z6838 Body mass index (BMI) 38.0-38.9, adult: Secondary | ICD-10-CM | POA: Diagnosis not present

## 2018-08-11 DIAGNOSIS — J209 Acute bronchitis, unspecified: Secondary | ICD-10-CM | POA: Diagnosis not present

## 2018-10-13 DIAGNOSIS — G4733 Obstructive sleep apnea (adult) (pediatric): Secondary | ICD-10-CM | POA: Diagnosis not present

## 2019-03-04 DIAGNOSIS — G4733 Obstructive sleep apnea (adult) (pediatric): Secondary | ICD-10-CM | POA: Diagnosis not present

## 2019-03-31 ENCOUNTER — Other Ambulatory Visit: Payer: Self-pay

## 2019-03-31 ENCOUNTER — Emergency Department (HOSPITAL_COMMUNITY): Payer: 59

## 2019-03-31 ENCOUNTER — Emergency Department (HOSPITAL_BASED_OUTPATIENT_CLINIC_OR_DEPARTMENT_OTHER)
Admission: EM | Admit: 2019-03-31 | Discharge: 2019-03-31 | Disposition: A | Discharge status: Home / Self Care | Payer: 59 | Attending: Emergency Medicine | Admitting: Emergency Medicine

## 2019-03-31 ENCOUNTER — Encounter (HOSPITAL_BASED_OUTPATIENT_CLINIC_OR_DEPARTMENT_OTHER): Payer: Self-pay

## 2019-03-31 ENCOUNTER — Emergency Department (HOSPITAL_BASED_OUTPATIENT_CLINIC_OR_DEPARTMENT_OTHER): Payer: 59

## 2019-03-31 DIAGNOSIS — R202 Paresthesia of skin: Secondary | ICD-10-CM | POA: Diagnosis not present

## 2019-03-31 DIAGNOSIS — G245 Blepharospasm: Secondary | ICD-10-CM

## 2019-03-31 DIAGNOSIS — E119 Type 2 diabetes mellitus without complications: Secondary | ICD-10-CM | POA: Diagnosis not present

## 2019-03-31 DIAGNOSIS — E039 Hypothyroidism, unspecified: Secondary | ICD-10-CM | POA: Diagnosis not present

## 2019-03-31 DIAGNOSIS — R531 Weakness: Secondary | ICD-10-CM | POA: Insufficient documentation

## 2019-03-31 DIAGNOSIS — Z79899 Other long term (current) drug therapy: Secondary | ICD-10-CM | POA: Insufficient documentation

## 2019-03-31 DIAGNOSIS — Z794 Long term (current) use of insulin: Secondary | ICD-10-CM | POA: Diagnosis not present

## 2019-03-31 DIAGNOSIS — M6281 Muscle weakness (generalized): Secondary | ICD-10-CM | POA: Diagnosis not present

## 2019-03-31 DIAGNOSIS — H538 Other visual disturbances: Secondary | ICD-10-CM | POA: Diagnosis not present

## 2019-03-31 DIAGNOSIS — R200 Anesthesia of skin: Secondary | ICD-10-CM | POA: Diagnosis not present

## 2019-03-31 DIAGNOSIS — H029 Unspecified disorder of eyelid: Secondary | ICD-10-CM | POA: Diagnosis not present

## 2019-03-31 LAB — CBC
HCT: 42.7 % (ref 36.0–46.0)
Hemoglobin: 13.8 g/dL (ref 12.0–15.0)
MCH: 27 pg (ref 26.0–34.0)
MCHC: 32.3 g/dL (ref 30.0–36.0)
MCV: 83.4 fL (ref 80.0–100.0)
Platelets: 332 10*3/uL (ref 150–400)
RBC: 5.12 MIL/uL — ABNORMAL HIGH (ref 3.87–5.11)
RDW: 13.9 % (ref 11.5–15.5)
WBC: 7.1 10*3/uL (ref 4.0–10.5)
nRBC: 0 % (ref 0.0–0.2)

## 2019-03-31 LAB — COMPREHENSIVE METABOLIC PANEL
ALT: 27 U/L (ref 0–44)
AST: 22 U/L (ref 15–41)
Albumin: 4.3 g/dL (ref 3.5–5.0)
Alkaline Phosphatase: 91 U/L (ref 38–126)
Anion gap: 14 (ref 5–15)
BUN: 9 mg/dL (ref 6–20)
CO2: 23 mmol/L (ref 22–32)
Calcium: 9.7 mg/dL (ref 8.9–10.3)
Chloride: 98 mmol/L (ref 98–111)
Creatinine, Ser: 0.8 mg/dL (ref 0.44–1.00)
GFR calc Af Amer: >60 mL/min (ref >60)
GFR calc non Af Amer: >60 mL/min (ref >60)
Glucose, Bld: 112 mg/dL — ABNORMAL HIGH (ref 70–99)
Potassium: 4.3 mmol/L (ref 3.5–5.1)
Sodium: 135 mmol/L (ref 135–145)
Total Bilirubin: 0.4 mg/dL (ref 0.3–1.2)
Total Protein: 7.9 g/dL (ref 6.5–8.1)

## 2019-03-31 LAB — DIFFERENTIAL
Abs Immature Granulocytes: 0.03 10*3/uL (ref 0.00–0.07)
Basophils Absolute: 0 10*3/uL (ref 0.0–0.1)
Basophils Relative: 0 %
Eosinophils Absolute: 0.4 10*3/uL (ref 0.0–0.5)
Eosinophils Relative: 6 %
Immature Granulocytes: 0 %
Lymphocytes Relative: 31 %
Lymphs Abs: 2.2 10*3/uL (ref 0.7–4.0)
Monocytes Absolute: 0.4 10*3/uL (ref 0.1–1.0)
Monocytes Relative: 6 %
Neutro Abs: 4.1 10*3/uL (ref 1.7–7.7)
Neutrophils Relative %: 57 %

## 2019-03-31 LAB — ETHANOL: Alcohol, Ethyl (B): <10 mg/dL (ref <10)

## 2019-03-31 LAB — PROTIME-INR
INR: 1 (ref 0.8–1.2)
Prothrombin Time: 13.3 seconds (ref 11.4–15.2)

## 2019-03-31 LAB — APTT: aPTT: 28 seconds (ref 24–36)

## 2019-03-31 MED ORDER — LORAZEPAM 2 MG/ML IJ SOLN
0.5000 mg | Freq: Once | INTRAMUSCULAR | Status: AC
  Administered 2019-03-31: 0.5 mg via INTRAVENOUS
  Filled 2019-03-31: qty 1

## 2019-03-31 NOTE — ED Notes (Signed)
Pt requets to have friend pick her up and transfer to [facility]. Pt made aware of risks involved in not transferring via [facility] or [facility]. Pt reports concern of insurance issues and copays.

## 2019-03-31 NOTE — Discharge Instructions (Signed)
Please follow up with your primary care provider within 5-7 days for re-evaluation of your symptoms.   You were  given a referral to neurology. The office will contact you in regards to a follow up appointment.   Please return to the emergency department for any new or worsening symptoms.

## 2019-03-31 NOTE — ED Triage Notes (Signed)
Pt c/o intermittent numbness to left side of face and to left arm, blurred vision x 2 months-weakness to left knee going up steps 2-3 days ago-pt has not sought medical tx for c/o-NAD-steady gait

## 2019-03-31 NOTE — ED Provider Notes (Signed)
Assumed care of patient after she was transferred from med center [facility] for MRI.  See [name]'s note for full H&P.  Per her note, [name] is a 58 y.o. female with a hx of DM, sleep apnea, hyperlipidemia, & hypothyroidism who presents to the ED with multiple neurologic complaints which have been occurring variably in terms of frequency & duration including blurry vision, L eye twitching, L sided facial paresthesias, LUE numbness, LUE weakness, & single episode of LLE weakness.  - Blurry vision: occurs bilaterally (L>R) intermittently x 2 months, approximately 1 episode per week that lasts about 20 minutes to 1 hour prior to spontaneous resolution. Not occurring @ present.  - L eye twitching: occurs constantly x 2 months.  - L sided facial paresthesias: Occurs intermittently x 2 months, 1-2 x per week initially now more daily, occurring presently.  - LUE numbness: Described as from the elbow distally x 2 months, 1-2 x per week, now more daily, not occurring @ present.  - LUE weakness: Started @ unknown time today, constant since onset, had trouble picking up a cup of soda which is atypical for her, also does have some L shoulder pain- had prior shoulder surgery - LLE weakness: 1 episode over the weekend LLE felt like it buckled and was weak, could not bear weight on it briefly, spontaneously resolved.  - She also mentions intermittent headaches since January.   No triggers to her sxs or alleviating/aggravating factors that she can identify. She called her PCP- recommended she come to the ED as they were concerned for stroke. Denies fever, chills, N/V, chest pain, dyspnea, dizziness like the room spinning, syncope, neck pain, or recent head injury. "   Physical Exam  BP 111/63   Pulse 74   Temp 99.4 F (37.4 C) (Oral)   Resp 18   Ht 5\' 4"  (1.626 m)   Wt 101 kg   SpO2 100%   BMI 38.21 kg/m   Physical Exam Vitals signs and nursing note reviewed.  Constitutional:       General: She is not in acute distress.    Appearance: She is well-developed.  HENT:     Head: Normocephalic and atraumatic.  Eyes:     Conjunctiva/sclera: Conjunctivae normal.  Neck:     Musculoskeletal: Neck supple.  Cardiovascular:     Rate and Rhythm: Normal rate.     Heart sounds: No murmur.  Pulmonary:     Effort: Pulmonary effort is normal.  Abdominal:     Palpations: Abdomen is soft.     Tenderness: There is no abdominal tenderness.  Skin:    General: Skin is warm and dry.  Neurological:     Mental Status: She is alert.     Comments: Mental Status:  Alert, thought content appropriate, able to give a coherent history. Speech fluent without evidence of aphasia. Able to follow 2 step commands without difficulty.  Cranial Nerves:  II:  pupils equal, round, reactive to light III,IV, VI: ptosis not present, extra-ocular motions intact bilaterally  V,VII: smile symmetric, abnormal sensation to the left lower face, otherwise normal VIII: hearing grossly normal to voice  X: uvula elevates symmetrically  XI: bilateral shoulder shrug symmetric and strong XII: midline tongue extension without fassiculations Motor:  Normal tone. Slightly decreased strength to the LUE with grip strength and elbow flexion, otherwise normal. 5/5 strength of the RUE and BLE major muscle groups including strong and equal grip strength and dorsiflexion/plantar flexion Sensory: light touch normal  in all extremities. Cerebellar: normal finger-to-nose with bilateral upper extremities Gait: normal gait and balance. Negative pronator drift     ED Course/Procedures     Procedures  Results for orders placed or performed during the hospital encounter of 03/31/19  Ethanol  Result Value Ref Range   Alcohol, Ethyl (B) <10 <10 mg/dL  Protime-INR  Result Value Ref Range   Prothrombin Time 13.3 11.4 - 15.2 seconds   INR 1.0 0.8 - 1.2  APTT  Result Value Ref Range   aPTT 28 24 - 36 seconds  CBC   Result Value Ref Range   WBC 7.1 4.0 - 10.5 K/uL   RBC 5.12 (H) 3.87 - 5.11 MIL/uL   Hemoglobin 13.8 12.0 - 15.0 g/dL   HCT 42.7 36.0 - 46.0 %   MCV 83.4 80.0 - 100.0 fL   MCH 27.0 26.0 - 34.0 pg   MCHC 32.3 30.0 - 36.0 g/dL   RDW 13.9 11.5 - 15.5 %   Platelets 332 150 - 400 K/uL   nRBC 0.0 0.0 - 0.2 %  Differential  Result Value Ref Range   Neutrophils Relative % 57 %   Neutro Abs 4.1 1.7 - 7.7 K/uL   Lymphocytes Relative 31 %   Lymphs Abs 2.2 0.7 - 4.0 K/uL   Monocytes Relative 6 %   Monocytes Absolute 0.4 0.1 - 1.0 K/uL   Eosinophils Relative 6 %   Eosinophils Absolute 0.4 0.0 - 0.5 K/uL   Basophils Relative 0 %   Basophils Absolute 0.0 0.0 - 0.1 K/uL   Immature Granulocytes 0 %   Abs Immature Granulocytes 0.03 0.00 - 0.07 K/uL  Comprehensive metabolic panel  Result Value Ref Range   Sodium 135 135 - 145 mmol/L   Potassium 4.3 3.5 - 5.1 mmol/L   Chloride 98 98 - 111 mmol/L   CO2 23 22 - 32 mmol/L   Glucose, Bld 112 (H) 70 - 99 mg/dL   BUN 9 6 - 20 mg/dL   Creatinine, Ser 0.80 0.44 - 1.00 mg/dL   Calcium 9.7 8.9 - 10.3 mg/dL   Total Protein 7.9 6.5 - 8.1 g/dL   Albumin 4.3 3.5 - 5.0 g/dL   AST 22 15 - 41 U/L   ALT 27 0 - 44 U/L   Alkaline Phosphatase 91 38 - 126 U/L   Total Bilirubin 0.4 0.3 - 1.2 mg/dL   GFR calc non Af Amer >60 >60 mL/min   GFR calc Af Amer >60 >60 mL/min   Anion gap 14 5 - 15   Ct Head Wo Contrast  Result Date: 03/31/2019 CLINICAL DATA:  Blurry vision and facial numbness EXAM: CT HEAD WITHOUT CONTRAST TECHNIQUE: Contiguous axial images were obtained from the base of the skull through the vertex without intravenous contrast. COMPARISON:  08/21/2017 FINDINGS: Brain: Mild atrophic changes are noted stable from the prior exam. No findings to suggest acute hemorrhage, acute infarction or space-occupying mass lesion are seen. Vascular: No hyperdense vessel or unexpected calcification. Skull: Normal. Negative for fracture or focal lesion. Sinuses/Orbits:  No acute finding. Other: None. IMPRESSION: No acute intracranial abnormality noted. Electronically Signed   By: Inez Catalina M.D.   On: 03/31/2019 15:18   Mr Brain Wo Contrast  Result Date: 03/31/2019 CLINICAL DATA:  Paresthesias numbness and weakness. EXAM: MRI HEAD WITHOUT CONTRAST TECHNIQUE: Multiplanar, multiecho pulse sequences of the brain and surrounding structures were obtained without intravenous contrast. COMPARISON:  CT head 03/31/2019 FINDINGS: Brain: No acute infarction, hemorrhage, hydrocephalus, extra-axial  collection or mass lesion. Normal white matter.  No evidence of demyelinating disease. Vascular: Normal arterial flow voids Skull and upper cervical spine: Negative Sinuses/Orbits: Mild mucosal edema paranasal sinuses.  Normal orbit Other: None IMPRESSION: Negative MRI brain Electronically Signed   By: Franchot Gallo M.D.   On: 03/31/2019 19:44     MDM   58 year old female presenting with multiple neurologic complaints with symptoms that have been waxing and waning over time.  She presented today due to concern for left upper extremity weakness that was transient in nature.  Weakness noted only from the elbow down.  She is also had intermittent numbness to this area.  She does not have any appreciable weakness to the remainder of her left upper extremity.  No lower extremity weakness.  Cranial nerves II through XII intact.  No sensory changes.  She had CT scan that was negative at med center [facility].  Her labs are reassuring.  She was sent here for eval with MRI  MRI was negative.  Discussed findings with patient.  At this time, she does not have any evidence of CVA, doubt TIA.  Her symptoms seem to be more peripheral in nature and I have low suspicion for other acute neurologic process.  I will give her referral to neurology for further work-up.  I have also advised her to follow-up with her PCP.  I have given her strict return precautions for any new or worsening symptoms.  She  voices understanding of the plan and reasons to return.  All questions answered.  Patient stable for discharge.      Rodney Booze, PA-C 03/31/19 2340    Carmin Muskrat, MD 04/01/19 [phone]

## 2019-03-31 NOTE — ED Notes (Signed)
Patient transported to MRI 

## 2019-03-31 NOTE — ED Notes (Signed)
ED TO INPATIENT HANDOFF REPORT  ED Nurse Name and Phone #: Barbaraann Cao [phone]  S Name/Age/Gender [name] 58 y.o. female Room/Bed: MH08/MH08  Code Status   Code Status: Prior  Home/SNF/Other [place] Patient oriented to: self, place, time and situation Is this baseline? Yes  Triage Complete: Triage complete  Chief Complaint facial tingling, twitching eye, shoulder pain  Triage Note Pt c/o intermittent numbness to left side of face and to left arm, blurred vision x 2 months-weakness to left knee going up steps 2-3 days ago-pt has not sought medical tx for c/o-NAD-steady gait   Allergies Allergies  Allergen Reactions  . Septra [Sulfamethoxazole-Trimethoprim] Itching and Rash  . Sulfa Antibiotics Hives    Level of Care/Admitting Diagnosis ED Disposition    ED Disposition Condition Comment   Transfer to Another Facility  The patient appears reasonably stabilized for transfer considering the current resources, flow, and capabilities available in the ED at this time, and I doubt any other [facility] requiring further screening and/or treatment in the ED prior to transfer is p resent.       B Medical/Surgery History Past Medical History:  Diagnosis Date  . Allergy    seasonal  . Cataract    early signs in both eyes  . Chronic kidney disease    had a kidney stone  . Diabetes mellitus without complication ([place])   . History of rectal bleeding    in hospital on 04/25/18  . Hyperlipidemia   . Sleep apnea    uses c-pap  . Thyroid disease    Past Surgical History:  Procedure Laterality Date  . jaw reduction     1984  . NASAL SEPTUM SURGERY  2000  . SHOULDER SURGERY  2017   arthroscopic surg on left shoulder due bone spurs     A IV Location/Drains/Wounds Patient Lines/Drains/Airways Status   Active Line/Drains/Airways    Name:   Placement date:   Placement time:   Site:   Days:   Peripheral IV 03/31/19 Right Antecubital   03/31/19    1529    Antecubital    less than 1   Wound / Incision (Open or Dehisced) 04/25/18 Diabetic ulcer Umbilicus small scab   [id number]    0000000    Umbilicus   [id number]          Intake/Output Last 24 hours No intake or output data in the 24 hours ending 03/31/19 1636  Labs/Imaging Results for orders placed or performed during the hospital encounter of 03/31/19 (from the past 48 hour(s))  Protime-INR     Status: None   Collection Time: 03/31/19  3:15 PM  Result Value Ref Range   Prothrombin Time 13.3 11.4 - 15.2 seconds   INR 1.0 0.8 - 1.2    Comment: (NOTE) INR goal varies based on device and disease states. Performed at [facility], [address] [place], [place] [address]   APTT     Status: None   Collection Time: 03/31/19  3:15 PM  Result Value Ref Range   aPTT 28 24 - 36 seconds    Comment: Performed at [facility], [address] [place], [place] [address]  CBC     Status: Abnormal   Collection Time: 03/31/19  3:15 PM  Result Value Ref Range   WBC 7.1 4.0 - 10.5 K/uL   RBC 5.12 (H) 3.87 - 5.11 MIL/uL   Hemoglobin 13.8 12.0 - 15.0 g/dL   HCT  42.7 36.0 - 46.0 %   MCV 83.4 80.0 - 100.0 fL   MCH 27.0 26.0 - 34.0 pg   MCHC 32.3 30.0 - 36.0 g/dL   RDW 13.9 11.5 - 15.5 %   Platelets 332 150 - 400 K/uL   nRBC 0.0 0.0 - 0.2 %    Comment: Performed at [facility], [address] [place], [place] [address]  Differential     Status: None   Collection Time: 03/31/19  3:15 PM  Result Value Ref Range   Neutrophils Relative % 57 %   Neutro Abs 4.1 1.7 - 7.7 K/uL   Lymphocytes Relative 31 %   Lymphs Abs 2.2 0.7 - 4.0 K/uL   Monocytes Relative 6 %   Monocytes Absolute 0.4 0.1 - 1.0 K/uL   Eosinophils Relative 6 %   Eosinophils Absolute 0.4 0.0 - 0.5 K/uL   Basophils Relative 0 %   Basophils Absolute 0.0 0.0 - 0.1 K/uL   Immature Granulocytes 0 %   Abs Immature Granulocytes 0.03 0.00 - 0.07 K/uL    Comment: Performed at [facility], [address]  [place], [place] [address]  Comprehensive metabolic panel     Status: Abnormal   Collection Time: 03/31/19  3:15 PM  Result Value Ref Range   Sodium 135 135 - 145 mmol/L   Potassium 4.3 3.5 - 5.1 mmol/L   Chloride 98 98 - 111 mmol/L   CO2 23 22 - 32 mmol/L   Glucose, Bld 112 (H) 70 - 99 mg/dL   BUN 9 6 - 20 mg/dL   Creatinine, Ser 0.80 0.44 - 1.00 mg/dL   Calcium 9.7 8.9 - 10.3 mg/dL   Total Protein 7.9 6.5 - 8.1 g/dL   Albumin 4.3 3.5 - 5.0 g/dL   AST 22 15 - 41 U/L   ALT 27 0 - 44 U/L   Alkaline Phosphatase 91 38 - 126 U/L   Total Bilirubin 0.4 0.3 - 1.2 mg/dL   GFR calc non Af Amer >60 >60 mL/min   GFR calc Af Amer >60 >60 mL/min   Anion gap 14 5 - 15    Comment: Performed at [facility], [address] [place], [place] [address]  Ethanol     Status: None   Collection Time: 03/31/19  3:16 PM  Result Value Ref Range   Alcohol, Ethyl (B) <10 <10 mg/dL    Comment: (NOTE) Lowest detectable limit for serum alcohol is 10 mg/dL. For medical purposes only. Performed at [facility], [address] [place], [place] [address]    Ct Head Wo Contrast  Result Date: 03/31/2019 CLINICAL DATA:  Blurry vision and facial numbness EXAM: CT HEAD WITHOUT CONTRAST TECHNIQUE: Contiguous axial images were obtained from the base of the skull through the vertex without intravenous contrast. COMPARISON:  08/21/2017 FINDINGS: Brain: Mild atrophic changes are noted stable from the prior exam. No findings to suggest acute hemorrhage, acute infarction or space-occupying mass lesion are seen. Vascular: No hyperdense vessel or unexpected calcification. Skull: Normal. Negative for fracture or focal lesion. Sinuses/Orbits: No acute finding. Other: None. IMPRESSION: No acute intracranial abnormality noted. Electronically Signed   By: Inez Catalina M.D.   On: 03/31/2019 15:18    Pending Labs Unresulted Labs (From admission, onward)    Start     Ordered   03/31/19 1451  Urine rapid drug screen  (hosp performed)  ONCE - STAT,   STAT  03/31/19 1450   03/31/19 1451  Urinalysis, Routine w reflex microscopic  ONCE - STAT,   STAT     03/31/19 1450          Vitals/Pain Today's Vitals   03/31/19 1427 03/31/19 1428 03/31/19 1511  BP: (!) 163/80    Pulse: 74    Resp: 18    Temp: 99.4 F (37.4 C)    TempSrc: Oral    SpO2: 100%    Weight:  101 kg   Height:  5\' 4"  (1.626 m)   PainSc: 3   2     Isolation Precautions No active isolations  Medications Medications  LORazepam (ATIVAN) injection 0.5 mg (has no administration in time range)    Mobility walks with person assist Low fall risk   Focused Assessments Neuro WDL   R Recommendations: See Admitting Provider Note  Report given to: Shon Millet RN  Additional Notes: Pt will be arriving POV

## 2019-03-31 NOTE — ED Provider Notes (Addendum)
[facility] EMERGENCY DEPARTMENT Provider Note   CSN: [id number] Arrival date & time: 03/31/19  1419     History   Chief Complaint Chief Complaint  Patient presents with  . Numbness    HPI [name] is a 58 y.o. female with a hx of DM, sleep apnea, hyperlipidemia, & hypothyroidism who presents to the ED with multiple neurologic complaints which have been occurring variably in terms of frequency & duration including blurry vision, L eye twitching, L sided facial paresthesias, LUE numbness, LUE weakness, & single episode of LLE weakness.  - Blurry vision: occurs bilaterally (L>R) intermittently x 2 months, approximately 1 episode per week that lasts about 20 minutes to 1 hour prior to spontaneous resolution. Not occurring @ present.  - L eye twitching: occurs constantly x 2 months.  - L sided facial paresthesias: Occurs intermittently x 2 months, 1-2 x per week initially now more daily, occurring presently.  - LUE numbness: Described as from the elbow distally x 2 months, 1-2 x per week, now more daily, not occurring @ present.  - LUE weakness: Started @ unknown time today, constant since onset, had trouble picking up a cup of soda which is atypical for her, also does have some L shoulder pain- had prior shoulder surgery - LLE weakness: 1 episode over the weekend LLE felt like it buckled and was weak, could not bear weight on it briefly, spontaneously resolved.  - She also mentions intermittent headaches since January.   No triggers to her sxs or alleviating/aggravating factors that she can identify. She called her PCP- recommended she come to the ED as they were concerned for stroke. Denies fever, chills, N/V, chest pain, dyspnea, dizziness like the room spinning, syncope, neck pain, or recent head injury.      HPI  Past Medical History:  Diagnosis Date  . Allergy    seasonal  . Cataract    early signs in both eyes  . Chronic kidney disease    had a kidney  stone  . Diabetes mellitus without complication ([place])   . History of rectal bleeding    in hospital on 04/25/18  . Hyperlipidemia   . Sleep apnea    uses c-pap  . Thyroid disease     Patient Active Problem List   Diagnosis Date Noted  . Hematochezia 04/25/2018  . Type 2 diabetes mellitus with hyperlipidemia ([place]) 04/25/2018  . Hypothyroidism 04/25/2018  . OSA on CPAP 04/25/2018    Past Surgical History:  Procedure Laterality Date  . jaw reduction     1984  . NASAL SEPTUM SURGERY  2000  . SHOULDER SURGERY  2017   arthroscopic surg on left shoulder due bone spurs     OB History   No obstetric history on file.      Home Medications    Prior to Admission medications   Medication Sig Start Date End Date Taking? Authorizing Provider  acetaminophen (TYLENOL) 325 MG tablet Take 2 tablets (650 mg total) by mouth every 6 (six) hours as needed for mild pain (or Fever >/= 101). Patient taking differently: Take 650 mg by mouth as needed for mild pain (or Fever >/= 101).  04/26/18   Georgette Shell, MD  atorvastatin (LIPITOR) 20 MG tablet Take 20 mg by mouth daily.    [provider]  Cholecalciferol (VITAMIN D-3) 5000 UNITS TABS Take 1 tablet by mouth daily.     [provider]  fluticasone (FLONASE) 50 MCG/ACT nasal spray  Place 1 spray into both nostrils as needed.     [provider]  insulin detemir (LEVEMIR) 100 UNIT/ML injection Inject 38 Units into the skin daily.     [provider]  levothyroxine (SYNTHROID, LEVOTHROID) 175 MCG tablet Take 175 mcg by mouth daily before breakfast.    [provider]  metFORMIN (GLUCOPHAGE) 500 MG tablet Take 2,000 mg by mouth daily with breakfast.     [provider]  vitamin B-12 (CYANOCOBALAMIN) 1000 MCG tablet Take 1,000 mcg by mouth daily.    [provider]    Family History Family History  Problem Relation Age of Onset  . Lung cancer Mother   . High blood pressure  Mother   . High Cholesterol Mother   . Esophageal cancer Father   . Dementia Father   . Alcoholism Father     Social History Social History   Tobacco Use  . Smoking status: Never Smoker  . Smokeless tobacco: Never Used  Substance Use Topics  . Alcohol use: Yes    Comment: rare  . Drug use: No     Allergies   Septra [sulfamethoxazole-trimethoprim] and Sulfa antibiotics   Review of Systems Review of Systems  Constitutional: Negative for chills and fever.  Eyes: Positive for visual disturbance.  Respiratory: Negative for shortness of breath.   Cardiovascular: Negative for chest pain.  Gastrointestinal: Negative for abdominal pain, nausea and vomiting.  Neurological: Positive for weakness, numbness and headaches. Negative for dizziness, seizures, syncope and speech difficulty.  All other systems reviewed and are negative.  Physical Exam Updated Vital Signs BP (!) 163/80 (BP Location: Left Arm)   Pulse 74   Temp 99.4 F (37.4 C) (Oral)   Resp 18   Ht 5\' 4"  (1.626 m)   Wt 101 kg   SpO2 100%   BMI 38.21 kg/m   Physical Exam Vitals signs and nursing note reviewed.  Constitutional:      General: She is not in acute distress.    Appearance: She is well-developed.  HENT:     Head: Normocephalic and atraumatic.     Right Ear: Ear canal normal. Tympanic membrane is not perforated, erythematous, retracted or bulging.     Left Ear: Ear canal normal. Tympanic membrane is not perforated, erythematous, retracted or bulging.     Ears:     Comments: No mastoid erythema/swelling/tenderness.     Nose:     Right Sinus: No maxillary sinus tenderness or frontal sinus tenderness.     Left Sinus: No maxillary sinus tenderness or frontal sinus tenderness.     Mouth/Throat:     Pharynx: Uvula midline. No oropharyngeal exudate or posterior oropharyngeal erythema.     Comments: Posterior oropharynx is symmetric appearing. Patient tolerating own secretions without difficulty. No  trismus. No drooling. No hot potato voice. No swelling beneath the tongue, submandibular compartment is soft.  Eyes:     General:        Right eye: No discharge.        Left eye: No discharge.     Conjunctiva/sclera: Conjunctivae normal.     Pupils: Pupils are equal, round, and reactive to light.  Neck:     Musculoskeletal: Normal range of motion and neck supple. No edema or neck rigidity.  Cardiovascular:     Rate and Rhythm: Normal rate and regular rhythm.     Heart sounds: No murmur.  Pulmonary:     Effort: Pulmonary effort is normal. No respiratory distress.  Breath sounds: Normal breath sounds. No wheezing, rhonchi or rales.  Abdominal:     General: There is no distension.     Palpations: Abdomen is soft.     Tenderness: There is no abdominal tenderness.  Lymphadenopathy:     Cervical: No cervical adenopathy.  Skin:    General: Skin is warm and dry.     Findings: No rash.  Neurological:     Mental Status: She is alert.     Comments: Alert. Clear speech. No facial droop. CNIII-XII grossly intact with the exception of subjective reported decreased sensation to L side of the face- able to discriminate sharp/dull touch. Bilateral upper and lower extremities' sensation grossly intact & intact to sharp/dull touch. LUE grip strength slightly decreased compared to the RUE. 5/5 symmetric strength with plantar and dorsi flexion bilaterally. Patellar DTRs are 2+ and symmetric . Normal finger to nose bilaterally. Negative pronator drift. Negative Romberg sign. Gait is steady and intact  Psychiatric:        Behavior: Behavior normal.    ED Treatments / Results  Labs (all labs ordered are listed, but only abnormal results are displayed) Labs Reviewed  CBC - Abnormal; Notable for the following components:      Result Value   RBC 5.12 (*)    All other components within normal limits  COMPREHENSIVE METABOLIC PANEL - Abnormal; Notable for the following components:   Glucose, Bld 112 (*)     All other components within normal limits  ETHANOL  PROTIME-INR  APTT  DIFFERENTIAL  RAPID URINE DRUG SCREEN, HOSP PERFORMED  URINALYSIS, ROUTINE W REFLEX MICROSCOPIC    EKG None  Radiology Ct Head Wo Contrast  Result Date: 03/31/2019 CLINICAL DATA:  Blurry vision and facial numbness EXAM: CT HEAD WITHOUT CONTRAST TECHNIQUE: Contiguous axial images were obtained from the base of the skull through the vertex without intravenous contrast. COMPARISON:  08/21/2017 FINDINGS: Brain: Mild atrophic changes are noted stable from the prior exam. No findings to suggest acute hemorrhage, acute infarction or space-occupying mass lesion are seen. Vascular: No hyperdense vessel or unexpected calcification. Skull: Normal. Negative for fracture or focal lesion. Sinuses/Orbits: No acute finding. Other: None. IMPRESSION: No acute intracranial abnormality noted. Electronically Signed   By: Inez Catalina M.D.   On: 03/31/2019 15:18    Procedures Procedures (including critical care time)  Medications Ordered in ED Medications - No data to display   Initial Impression / Assessment and Plan / ED Course  I have reviewed the triage vital signs and the nursing notes.  Pertinent labs & imaging results that were available during my care of the patient were reviewed by me and considered in my medical decision making (see chart for details).   Patient presents to the emergency department with multiple neurologic complaints of variable frequency and duration over the past 2 months.  Patient is nontoxic-appearing, no apparent distress, vitals WNL with the exception of her somewhat elevated blood pressure. Exam w/ subjective decreased sensation to L face but intact to sharp/dull touch as well as mildly decreased grip strength to LUE compared to RUE, otherwise no deficits. Not within code stroke window. Proceed w/ labs & CT head wo.   Work-up reviewed: CBC: Leukocytosis or anemia CMP: No significant electrolyte  derangement or renal dysfunction. Ethanol: WNL APTT/PT/INR: WNL UA/UDS: Pending CT head without contrast: No acute intracranial abnormality noted.   Discussed findings and plan of care with supervising physician Dr. Gilford Raid, recommends transfer to [facility] for MRI brain without  contrast which I am in agreement with.   16:05: CONSULT: Discussed w/ ER physician Dr. Billy Fischer- accepts transfer to [facility] ER.   Final Clinical Impressions(s) / ED Diagnoses   Final diagnoses:  None    ED Discharge Orders    None       Amaryllis Dyke, PA-C 03/31/19 [address], Glynda Jaeger, PA-C 04/01/19 0100    Isla Pence, MD 04/01/19 [phone]

## 2019-03-31 NOTE — ED Notes (Signed)
Patient transported to CT 

## 2019-03-31 NOTE — ED Notes (Signed)
Pt transferred to [facility] ED via POV, pt discharged with IV.

## 2019-04-03 DIAGNOSIS — G43909 Migraine, unspecified, not intractable, without status migrainosus: Secondary | ICD-10-CM | POA: Diagnosis not present

## 2019-04-04 DIAGNOSIS — G4733 Obstructive sleep apnea (adult) (pediatric): Secondary | ICD-10-CM | POA: Diagnosis not present

## 2019-04-29 ENCOUNTER — Telehealth: Payer: Self-pay | Admitting: Diagnostic Neuroimaging

## 2019-04-29 NOTE — Telephone Encounter (Signed)
I called patient and LVM to r/s 10/20 appt due to provider out.

## 2019-05-04 ENCOUNTER — Encounter: Payer: Self-pay | Admitting: Diagnostic Neuroimaging

## 2019-05-04 NOTE — Telephone Encounter (Signed)
Cancellation letter has been sent to patient to advise.

## 2019-05-19 ENCOUNTER — Ambulatory Visit: Payer: 59 | Admitting: Diagnostic Neuroimaging

## 2019-06-01 DIAGNOSIS — M546 Pain in thoracic spine: Secondary | ICD-10-CM | POA: Diagnosis not present

## 2019-06-01 DIAGNOSIS — M9901 Segmental and somatic dysfunction of cervical region: Secondary | ICD-10-CM | POA: Diagnosis not present

## 2019-06-01 DIAGNOSIS — M9902 Segmental and somatic dysfunction of thoracic region: Secondary | ICD-10-CM | POA: Diagnosis not present

## 2019-06-01 DIAGNOSIS — M50322 Other cervical disc degeneration at C5-C6 level: Secondary | ICD-10-CM | POA: Diagnosis not present

## 2019-06-03 DIAGNOSIS — M9902 Segmental and somatic dysfunction of thoracic region: Secondary | ICD-10-CM | POA: Diagnosis not present

## 2019-06-03 DIAGNOSIS — M9901 Segmental and somatic dysfunction of cervical region: Secondary | ICD-10-CM | POA: Diagnosis not present

## 2019-06-03 DIAGNOSIS — M546 Pain in thoracic spine: Secondary | ICD-10-CM | POA: Diagnosis not present

## 2019-06-03 DIAGNOSIS — M50322 Other cervical disc degeneration at C5-C6 level: Secondary | ICD-10-CM | POA: Diagnosis not present

## 2019-06-04 DIAGNOSIS — M9901 Segmental and somatic dysfunction of cervical region: Secondary | ICD-10-CM | POA: Diagnosis not present

## 2019-06-04 DIAGNOSIS — M546 Pain in thoracic spine: Secondary | ICD-10-CM | POA: Diagnosis not present

## 2019-06-04 DIAGNOSIS — M50322 Other cervical disc degeneration at C5-C6 level: Secondary | ICD-10-CM | POA: Diagnosis not present

## 2019-06-04 DIAGNOSIS — M9902 Segmental and somatic dysfunction of thoracic region: Secondary | ICD-10-CM | POA: Diagnosis not present

## 2019-06-09 DIAGNOSIS — M9901 Segmental and somatic dysfunction of cervical region: Secondary | ICD-10-CM | POA: Diagnosis not present

## 2019-06-09 DIAGNOSIS — M9902 Segmental and somatic dysfunction of thoracic region: Secondary | ICD-10-CM | POA: Diagnosis not present

## 2019-06-09 DIAGNOSIS — M50322 Other cervical disc degeneration at C5-C6 level: Secondary | ICD-10-CM | POA: Diagnosis not present

## 2019-06-09 DIAGNOSIS — M546 Pain in thoracic spine: Secondary | ICD-10-CM | POA: Diagnosis not present

## 2019-06-10 DIAGNOSIS — M546 Pain in thoracic spine: Secondary | ICD-10-CM | POA: Diagnosis not present

## 2019-06-10 DIAGNOSIS — M9902 Segmental and somatic dysfunction of thoracic region: Secondary | ICD-10-CM | POA: Diagnosis not present

## 2019-06-10 DIAGNOSIS — M50322 Other cervical disc degeneration at C5-C6 level: Secondary | ICD-10-CM | POA: Diagnosis not present

## 2019-06-10 DIAGNOSIS — M9901 Segmental and somatic dysfunction of cervical region: Secondary | ICD-10-CM | POA: Diagnosis not present

## 2019-06-16 DIAGNOSIS — M9901 Segmental and somatic dysfunction of cervical region: Secondary | ICD-10-CM | POA: Diagnosis not present

## 2019-06-16 DIAGNOSIS — M50322 Other cervical disc degeneration at C5-C6 level: Secondary | ICD-10-CM | POA: Diagnosis not present

## 2019-06-16 DIAGNOSIS — M546 Pain in thoracic spine: Secondary | ICD-10-CM | POA: Diagnosis not present

## 2019-06-16 DIAGNOSIS — M9902 Segmental and somatic dysfunction of thoracic region: Secondary | ICD-10-CM | POA: Diagnosis not present

## 2019-06-17 DIAGNOSIS — M9901 Segmental and somatic dysfunction of cervical region: Secondary | ICD-10-CM | POA: Diagnosis not present

## 2019-06-17 DIAGNOSIS — M546 Pain in thoracic spine: Secondary | ICD-10-CM | POA: Diagnosis not present

## 2019-06-17 DIAGNOSIS — M9902 Segmental and somatic dysfunction of thoracic region: Secondary | ICD-10-CM | POA: Diagnosis not present

## 2019-06-17 DIAGNOSIS — M50322 Other cervical disc degeneration at C5-C6 level: Secondary | ICD-10-CM | POA: Diagnosis not present

## 2019-06-18 DIAGNOSIS — M50322 Other cervical disc degeneration at C5-C6 level: Secondary | ICD-10-CM | POA: Diagnosis not present

## 2019-06-18 DIAGNOSIS — M546 Pain in thoracic spine: Secondary | ICD-10-CM | POA: Diagnosis not present

## 2019-06-18 DIAGNOSIS — M9902 Segmental and somatic dysfunction of thoracic region: Secondary | ICD-10-CM | POA: Diagnosis not present

## 2019-06-18 DIAGNOSIS — M9901 Segmental and somatic dysfunction of cervical region: Secondary | ICD-10-CM | POA: Diagnosis not present

## 2019-06-22 DIAGNOSIS — M546 Pain in thoracic spine: Secondary | ICD-10-CM | POA: Diagnosis not present

## 2019-06-22 DIAGNOSIS — M9901 Segmental and somatic dysfunction of cervical region: Secondary | ICD-10-CM | POA: Diagnosis not present

## 2019-06-22 DIAGNOSIS — M9902 Segmental and somatic dysfunction of thoracic region: Secondary | ICD-10-CM | POA: Diagnosis not present

## 2019-06-22 DIAGNOSIS — M50322 Other cervical disc degeneration at C5-C6 level: Secondary | ICD-10-CM | POA: Diagnosis not present

## 2019-06-23 DIAGNOSIS — M9902 Segmental and somatic dysfunction of thoracic region: Secondary | ICD-10-CM | POA: Diagnosis not present

## 2019-06-23 DIAGNOSIS — M546 Pain in thoracic spine: Secondary | ICD-10-CM | POA: Diagnosis not present

## 2019-06-23 DIAGNOSIS — M9901 Segmental and somatic dysfunction of cervical region: Secondary | ICD-10-CM | POA: Diagnosis not present

## 2019-06-23 DIAGNOSIS — M50322 Other cervical disc degeneration at C5-C6 level: Secondary | ICD-10-CM | POA: Diagnosis not present

## 2019-06-24 DIAGNOSIS — M546 Pain in thoracic spine: Secondary | ICD-10-CM | POA: Diagnosis not present

## 2019-06-24 DIAGNOSIS — M9901 Segmental and somatic dysfunction of cervical region: Secondary | ICD-10-CM | POA: Diagnosis not present

## 2019-06-24 DIAGNOSIS — M9902 Segmental and somatic dysfunction of thoracic region: Secondary | ICD-10-CM | POA: Diagnosis not present

## 2019-06-24 DIAGNOSIS — M50322 Other cervical disc degeneration at C5-C6 level: Secondary | ICD-10-CM | POA: Diagnosis not present

## 2019-06-30 DIAGNOSIS — M50322 Other cervical disc degeneration at C5-C6 level: Secondary | ICD-10-CM | POA: Diagnosis not present

## 2019-06-30 DIAGNOSIS — M546 Pain in thoracic spine: Secondary | ICD-10-CM | POA: Diagnosis not present

## 2019-06-30 DIAGNOSIS — M9901 Segmental and somatic dysfunction of cervical region: Secondary | ICD-10-CM | POA: Diagnosis not present

## 2019-06-30 DIAGNOSIS — M9902 Segmental and somatic dysfunction of thoracic region: Secondary | ICD-10-CM | POA: Diagnosis not present

## 2019-07-01 DIAGNOSIS — M50322 Other cervical disc degeneration at C5-C6 level: Secondary | ICD-10-CM | POA: Diagnosis not present

## 2019-07-01 DIAGNOSIS — M9902 Segmental and somatic dysfunction of thoracic region: Secondary | ICD-10-CM | POA: Diagnosis not present

## 2019-07-01 DIAGNOSIS — M9901 Segmental and somatic dysfunction of cervical region: Secondary | ICD-10-CM | POA: Diagnosis not present

## 2019-07-01 DIAGNOSIS — M546 Pain in thoracic spine: Secondary | ICD-10-CM | POA: Diagnosis not present

## 2019-07-02 DIAGNOSIS — M9902 Segmental and somatic dysfunction of thoracic region: Secondary | ICD-10-CM | POA: Diagnosis not present

## 2019-07-02 DIAGNOSIS — M50322 Other cervical disc degeneration at C5-C6 level: Secondary | ICD-10-CM | POA: Diagnosis not present

## 2019-07-02 DIAGNOSIS — M9901 Segmental and somatic dysfunction of cervical region: Secondary | ICD-10-CM | POA: Diagnosis not present

## 2019-07-02 DIAGNOSIS — M546 Pain in thoracic spine: Secondary | ICD-10-CM | POA: Diagnosis not present

## 2019-07-06 DIAGNOSIS — M9901 Segmental and somatic dysfunction of cervical region: Secondary | ICD-10-CM | POA: Diagnosis not present

## 2019-07-06 DIAGNOSIS — M9902 Segmental and somatic dysfunction of thoracic region: Secondary | ICD-10-CM | POA: Diagnosis not present

## 2019-07-06 DIAGNOSIS — M546 Pain in thoracic spine: Secondary | ICD-10-CM | POA: Diagnosis not present

## 2019-07-06 DIAGNOSIS — M50322 Other cervical disc degeneration at C5-C6 level: Secondary | ICD-10-CM | POA: Diagnosis not present

## 2019-07-07 DIAGNOSIS — M546 Pain in thoracic spine: Secondary | ICD-10-CM | POA: Diagnosis not present

## 2019-07-07 DIAGNOSIS — M9902 Segmental and somatic dysfunction of thoracic region: Secondary | ICD-10-CM | POA: Diagnosis not present

## 2019-07-07 DIAGNOSIS — M50322 Other cervical disc degeneration at C5-C6 level: Secondary | ICD-10-CM | POA: Diagnosis not present

## 2019-07-07 DIAGNOSIS — M9901 Segmental and somatic dysfunction of cervical region: Secondary | ICD-10-CM | POA: Diagnosis not present

## 2019-07-09 DIAGNOSIS — M546 Pain in thoracic spine: Secondary | ICD-10-CM | POA: Diagnosis not present

## 2019-07-09 DIAGNOSIS — M50322 Other cervical disc degeneration at C5-C6 level: Secondary | ICD-10-CM | POA: Diagnosis not present

## 2019-07-09 DIAGNOSIS — M9901 Segmental and somatic dysfunction of cervical region: Secondary | ICD-10-CM | POA: Diagnosis not present

## 2019-07-09 DIAGNOSIS — M9902 Segmental and somatic dysfunction of thoracic region: Secondary | ICD-10-CM | POA: Diagnosis not present

## 2019-07-10 DIAGNOSIS — Z0000 Encounter for general adult medical examination without abnormal findings: Secondary | ICD-10-CM | POA: Diagnosis not present

## 2019-07-13 DIAGNOSIS — M546 Pain in thoracic spine: Secondary | ICD-10-CM | POA: Diagnosis not present

## 2019-07-13 DIAGNOSIS — M9902 Segmental and somatic dysfunction of thoracic region: Secondary | ICD-10-CM | POA: Diagnosis not present

## 2019-07-13 DIAGNOSIS — M50322 Other cervical disc degeneration at C5-C6 level: Secondary | ICD-10-CM | POA: Diagnosis not present

## 2019-07-13 DIAGNOSIS — M9901 Segmental and somatic dysfunction of cervical region: Secondary | ICD-10-CM | POA: Diagnosis not present

## 2019-07-15 DIAGNOSIS — M546 Pain in thoracic spine: Secondary | ICD-10-CM | POA: Diagnosis not present

## 2019-07-15 DIAGNOSIS — M50322 Other cervical disc degeneration at C5-C6 level: Secondary | ICD-10-CM | POA: Diagnosis not present

## 2019-07-15 DIAGNOSIS — M9901 Segmental and somatic dysfunction of cervical region: Secondary | ICD-10-CM | POA: Diagnosis not present

## 2019-07-15 DIAGNOSIS — M9902 Segmental and somatic dysfunction of thoracic region: Secondary | ICD-10-CM | POA: Diagnosis not present

## 2019-08-03 DIAGNOSIS — E039 Hypothyroidism, unspecified: Secondary | ICD-10-CM | POA: Diagnosis not present

## 2019-08-03 DIAGNOSIS — E1165 Type 2 diabetes mellitus with hyperglycemia: Secondary | ICD-10-CM | POA: Diagnosis not present

## 2019-08-03 DIAGNOSIS — E785 Hyperlipidemia, unspecified: Secondary | ICD-10-CM | POA: Diagnosis not present

## 2019-08-03 DIAGNOSIS — Z5181 Encounter for therapeutic drug level monitoring: Secondary | ICD-10-CM | POA: Diagnosis not present

## 2019-08-10 DIAGNOSIS — M9901 Segmental and somatic dysfunction of cervical region: Secondary | ICD-10-CM | POA: Diagnosis not present

## 2019-08-10 DIAGNOSIS — M9902 Segmental and somatic dysfunction of thoracic region: Secondary | ICD-10-CM | POA: Diagnosis not present

## 2019-08-10 DIAGNOSIS — M546 Pain in thoracic spine: Secondary | ICD-10-CM | POA: Diagnosis not present

## 2019-08-10 DIAGNOSIS — M50322 Other cervical disc degeneration at C5-C6 level: Secondary | ICD-10-CM | POA: Diagnosis not present

## 2019-08-11 DIAGNOSIS — M47812 Spondylosis without myelopathy or radiculopathy, cervical region: Secondary | ICD-10-CM | POA: Diagnosis not present

## 2019-10-19 DIAGNOSIS — G4733 Obstructive sleep apnea (adult) (pediatric): Secondary | ICD-10-CM | POA: Diagnosis not present

## 2019-10-29 DIAGNOSIS — E119 Type 2 diabetes mellitus without complications: Secondary | ICD-10-CM | POA: Diagnosis not present

## 2019-12-11 DIAGNOSIS — Z01419 Encounter for gynecological examination (general) (routine) without abnormal findings: Secondary | ICD-10-CM | POA: Diagnosis not present

## 2019-12-11 DIAGNOSIS — L292 Pruritus vulvae: Secondary | ICD-10-CM | POA: Diagnosis not present

## 2019-12-11 DIAGNOSIS — Z6838 Body mass index (BMI) 38.0-38.9, adult: Secondary | ICD-10-CM | POA: Diagnosis not present

## 2019-12-29 DIAGNOSIS — Z1231 Encounter for screening mammogram for malignant neoplasm of breast: Secondary | ICD-10-CM | POA: Diagnosis not present

## 2020-02-18 IMAGING — CT CT HEAD W/O CM
3 series · 15 of 46 positions shown, 18 images · non-contrast
Comparison: 08/21/2017

CLINICAL DATA: Blurry vision and facial numbness

EXAM:
CT HEAD WITHOUT CONTRAST
TECHNIQUE: Contiguous axial images were obtained from the base of the skull
through the vertex without intravenous contrast.

[Series 2: head wo · axial · 0.41mm/px · z∈[-169,-49]mm · 9 of 29 slices shown, 12 images]
[im 3/29  brain]
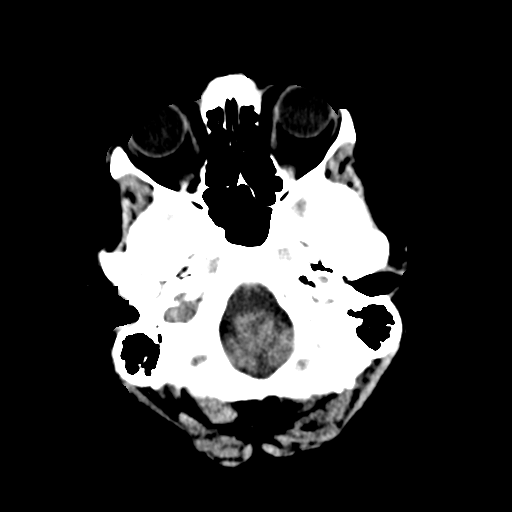
[im 3/29  bone]
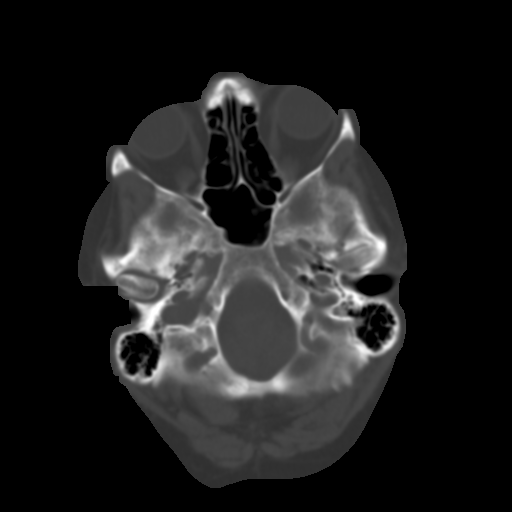
[im 6/29  brain]
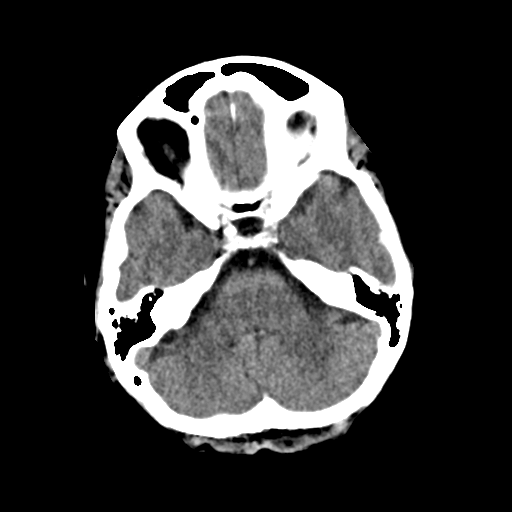
[im 9/29  brain]
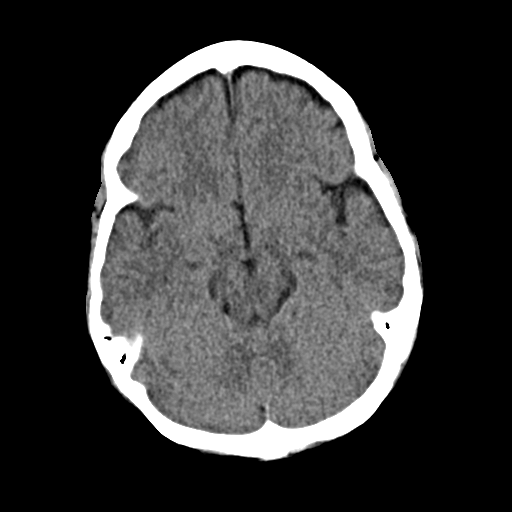
[im 12/29  brain]
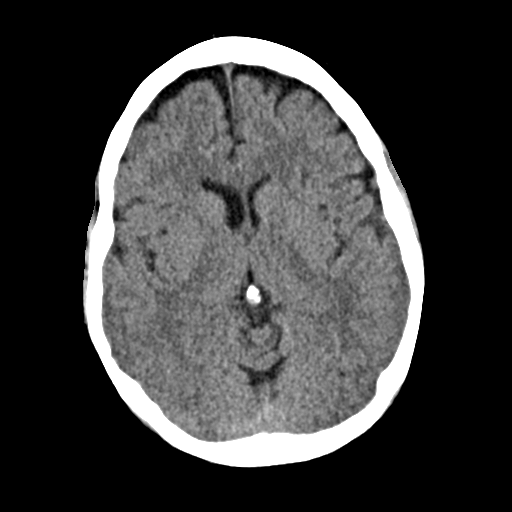
[im 15/29  brain]
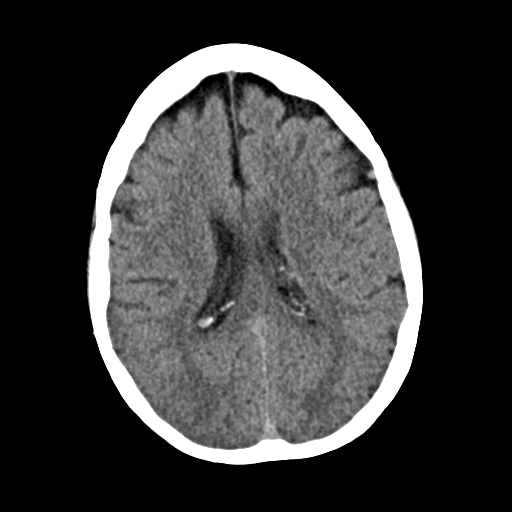
[im 15/29  bone]
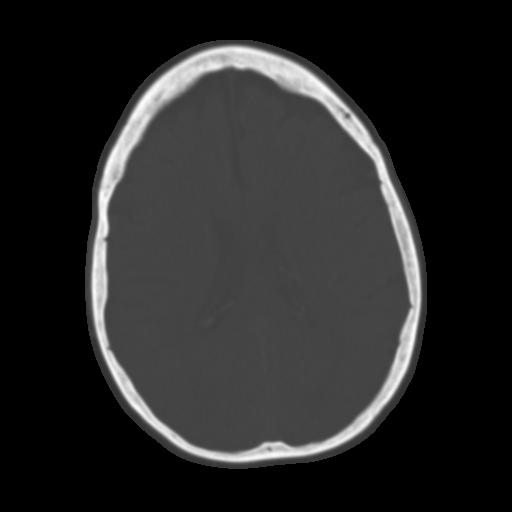
[im 18/29  brain]
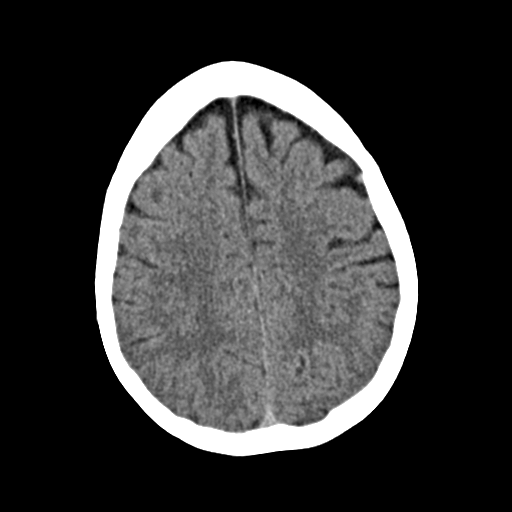
[im 21/29  brain]
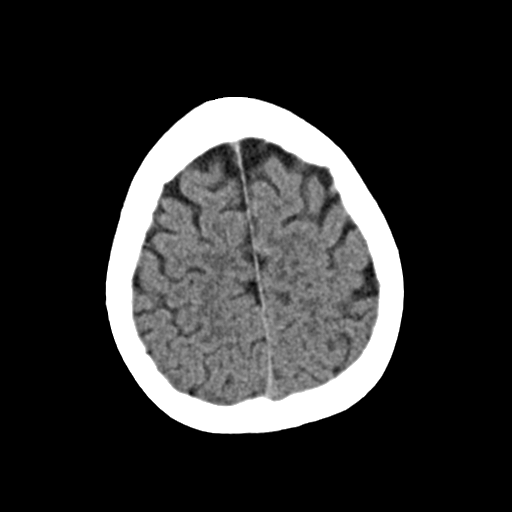
[im 24/29  brain]
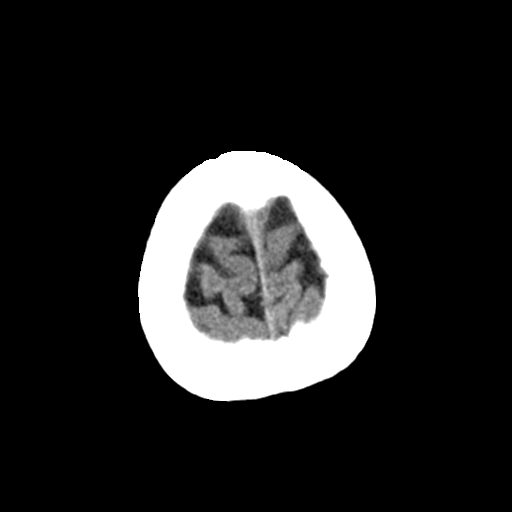
[im 27/29  brain]
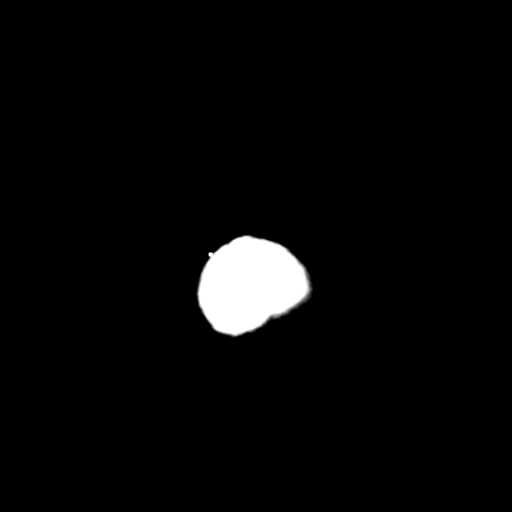
[im 27/29  bone]
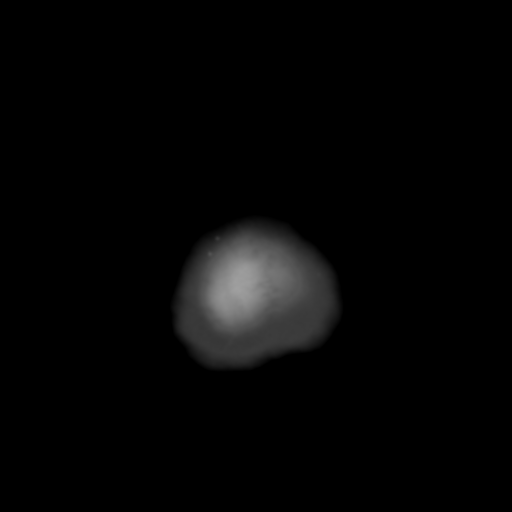

[Series 4: coronal soft · coronal · 0.29mm/px · 3 of 64 slices shown]
[im 22/64  brain]
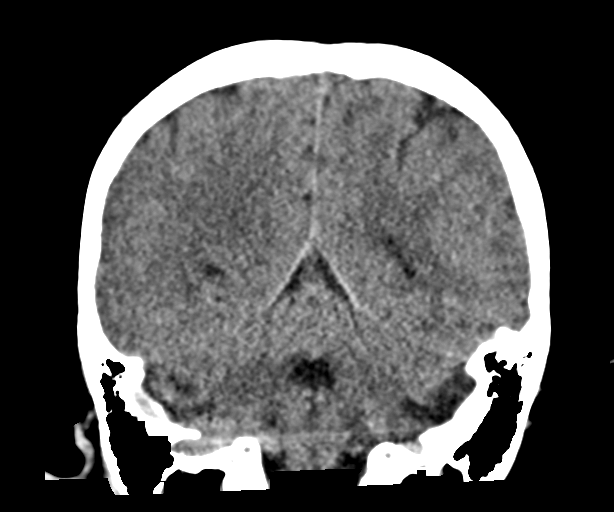
[im 29/64  brain]
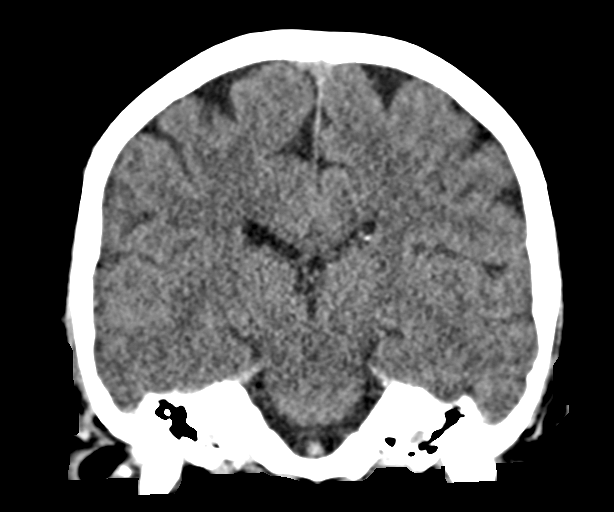
[im 36/64  brain]
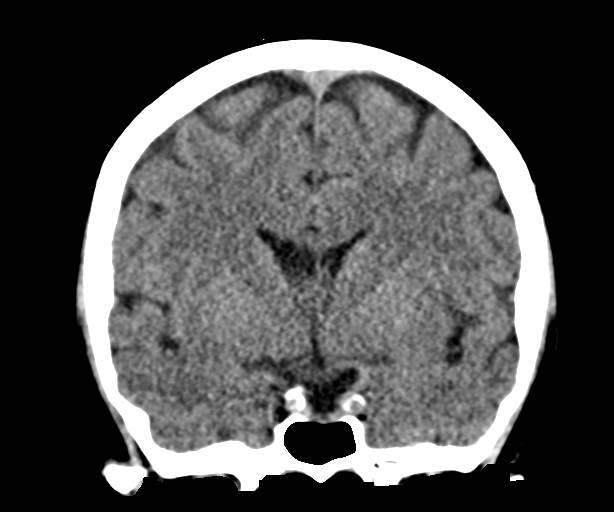

[Series 5: sag soft · sagittal · 0.29mm/px · 3 of 54 slices shown]
[im 18/54  brain]
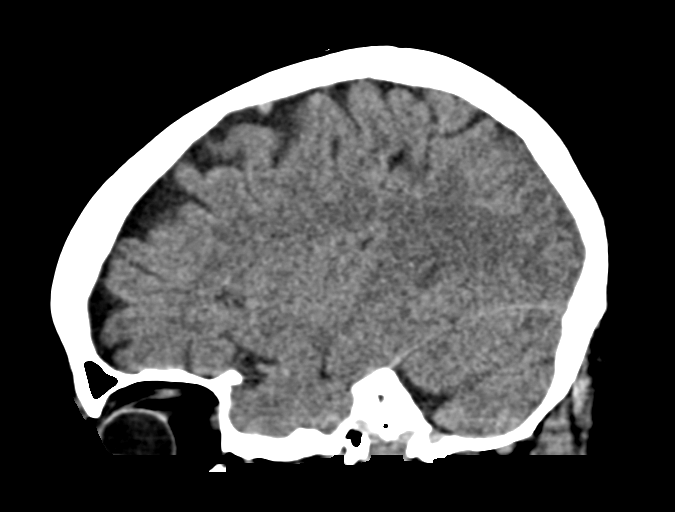
[im 27/54  brain]
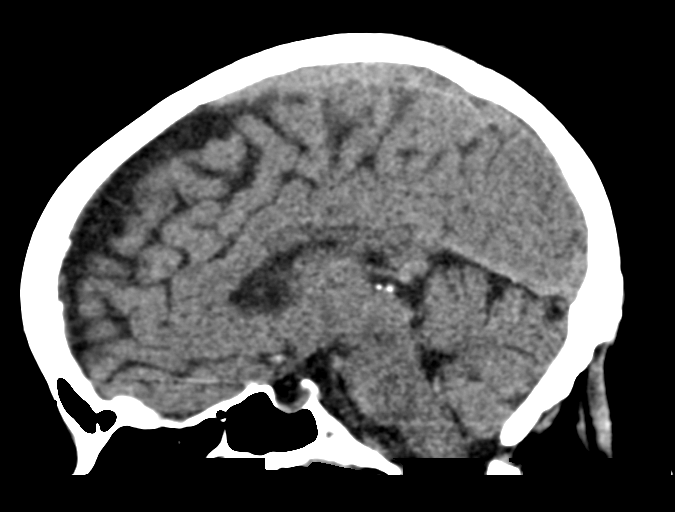
[im 36/54  brain]
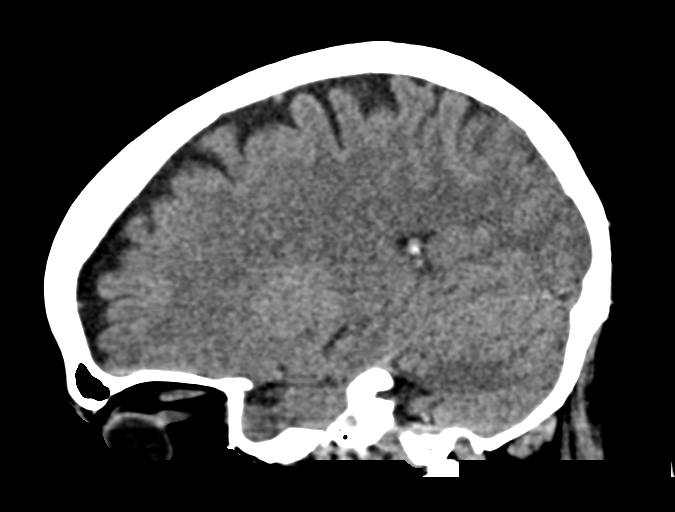

[15 of 46 positions shown; findings below may reference images not displayed]

FINDINGS: Brain: Mild atrophic changes are noted stable from the prior exam.
No findings to suggest acute hemorrhage, acute infarction or
space-occupying mass lesion are seen.

Vascular: No hyperdense vessel or unexpected calcification.

Skull: Normal. Negative for fracture or focal lesion.

Sinuses/Orbits: No acute finding.

Other: None.
IMPRESSION: No acute intracranial abnormality noted.

## 2020-03-17 DIAGNOSIS — E1165 Type 2 diabetes mellitus with hyperglycemia: Secondary | ICD-10-CM | POA: Diagnosis not present

## 2020-03-17 DIAGNOSIS — G43909 Migraine, unspecified, not intractable, without status migrainosus: Secondary | ICD-10-CM | POA: Diagnosis not present

## 2020-06-08 DIAGNOSIS — M21621 Bunionette of right foot: Secondary | ICD-10-CM | POA: Diagnosis not present

## 2020-06-08 DIAGNOSIS — M71571 Other bursitis, not elsewhere classified, right ankle and foot: Secondary | ICD-10-CM | POA: Diagnosis not present

## 2020-06-15 ENCOUNTER — Ambulatory Visit: Payer: 59 | Admitting: Podiatry

## 2020-06-20 ENCOUNTER — Ambulatory Visit (INDEPENDENT_AMBULATORY_CARE_PROVIDER_SITE_OTHER): Payer: 59

## 2020-06-20 ENCOUNTER — Other Ambulatory Visit: Payer: Self-pay

## 2020-06-20 ENCOUNTER — Ambulatory Visit: Payer: 59 | Admitting: Podiatry

## 2020-06-20 DIAGNOSIS — L989 Disorder of the skin and subcutaneous tissue, unspecified: Secondary | ICD-10-CM

## 2020-06-20 DIAGNOSIS — M21621 Bunionette of right foot: Secondary | ICD-10-CM

## 2020-06-20 NOTE — Progress Notes (Signed)
   HPI: 59 y.o. female presenting today as a new patient for evaluation of right foot pain.  Patient states that she has had pain off and on for over 1 year now.  She was referred by Dr. Gershon Mussel, local podiatrist for possible surgery.  She presents for further treatment and evaluation  Past Medical History:  Diagnosis Date  . Allergy    seasonal  . Cataract    early signs in both eyes  . Chronic kidney disease    had a kidney stone  . Diabetes mellitus without complication ([place])   . History of rectal bleeding    in hospital on 04/25/18  . Hyperlipidemia   . Sleep apnea    uses c-pap  . Thyroid disease      Physical Exam: General: The patient is alert and oriented x3 in no acute distress.  Dermatology: Skin is warm, dry and supple bilateral lower extremities. Negative for open lesions or macerations.  Hyperkeratosis with a central nucleated core noted to the plantar aspect of the fifth MPJ right foot  Vascular: Palpable pedal pulses bilaterally. No edema or erythema noted. Capillary refill within normal limits.  Neurological: Epicritic and protective threshold grossly intact bilaterally.   Musculoskeletal Exam: Range of motion within normal limits to all pedal and ankle joints bilateral. Muscle strength 5/5 in all groups bilateral.  Tenderness to palpation along the plantar aspect of the fifth MTPJ directly overlying the porokeratotic lesions.  Radiographic Exam:  Normal osseous mineralization. Joint spaces preserved. No fracture/dislocation/boney destruction.  Increased intermetatarsal angle noted to the fifth metatarsal with lateral deviation.  Findings consistent with a tailor's bunionectomy  Assessment: 1.  Tailor's bunion right 2.  Porokeratosis right fifth MTPJ   Plan of Care:  1. Patient evaluated. X-Rays reviewed.  2.  Today we discussed surgical versus conservative treatment modalities.  At the moment I recommend some additional conservative treatment prior to proceeding  with any surgical intervention.  The patient's main symptom is the hyperkeratotic callus tissue. 3.  Excisional debridement of the hyperkeratotic callus tissue was performed using a chisel blade without incident or bleeding.  Salicylic acid applied. 4.  Recommend corn and callus remover daily as needed 5.  Return to clinic in 4 weeks.  If the patient is still symptomatic despite treating the calluses I do recommend tailor's bunionectomy procedure  *Currently unemployed      Edrick Kins, DPM [facility]  Dr. Edrick Kins, DPM    [address], [place] [address]                Office [phone]  Fax [phone]

## 2020-07-25 ENCOUNTER — Ambulatory Visit: Payer: 59 | Admitting: Podiatry

## 2021-07-31 ENCOUNTER — Ambulatory Visit (INDEPENDENT_AMBULATORY_CARE_PROVIDER_SITE_OTHER): Payer: Managed Care, Other (non HMO)

## 2021-07-31 ENCOUNTER — Other Ambulatory Visit: Payer: Self-pay

## 2021-07-31 ENCOUNTER — Encounter (HOSPITAL_COMMUNITY): Payer: Self-pay

## 2021-07-31 ENCOUNTER — Ambulatory Visit (HOSPITAL_COMMUNITY)
Admission: EM | Admit: 2021-07-31 | Discharge: 2021-07-31 | Disposition: A | Discharge status: Home / Self Care | Payer: Managed Care, Other (non HMO) | Attending: Family Medicine | Admitting: Family Medicine

## 2021-07-31 DIAGNOSIS — M25531 Pain in right wrist: Secondary | ICD-10-CM | POA: Diagnosis not present

## 2021-07-31 NOTE — ED Triage Notes (Signed)
Pt presents with right wrist pain X 1 week.

## 2021-07-31 NOTE — ED Provider Notes (Signed)
[place]    CSN: [id number] Arrival date & time: 07/31/21  1251      History   Chief Complaint Chief Complaint  Patient presents with   Wrist Pain    HPI [name] is a 61 y.o. female.    Wrist Pain  Here for pain in the radial side of her right wrist for about 1 week. Thinks maybe she knocked against a hard surface when she was doing a lot of cooking before Christmas, but is unsure. No swelling, but states she does not swell.  No f/c/URI symptoms. PMH: DM  Past Medical History:  Diagnosis Date   Allergy    seasonal   Cataract    early signs in both eyes   Chronic kidney disease    had a kidney stone   Diabetes mellitus without complication ([place])    History of rectal bleeding    in hospital on 04/25/18   Hyperlipidemia    Sleep apnea    uses c-pap   Thyroid disease     Patient Active Problem List   Diagnosis Date Noted   Hematochezia 04/25/2018   Type 2 diabetes mellitus with hyperlipidemia ([place]) 04/25/2018   Hypothyroidism 04/25/2018   OSA on CPAP 04/25/2018   Parotitis 08/22/2017    Past Surgical History:  Procedure Laterality Date   jaw reduction     [place]   SHOULDER SURGERY  2017   arthroscopic surg on left shoulder due bone spurs    OB History   No obstetric history on file.      Home Medications    Prior to Admission medications   Medication Sig Start Date End Date Taking? Authorizing Provider  acetaminophen (TYLENOL) 325 MG tablet Take 2 tablets (650 mg total) by mouth every 6 (six) hours as needed for mild pain (or Fever >/= 101). Patient taking differently: Take 650 mg by mouth 3 (three) times daily as needed for mild pain (or Fever >/= 101).  04/26/18   Georgette Shell, MD  atorvastatin (LIPITOR) 20 MG tablet Take 20 mg by mouth daily.    [provider]  BD PEN NEEDLE NANO U/F 32G X 4 MM MISC SMARTSIG:1 Each SUB-Q Daily 03/25/20   [provider]   Cholecalciferol (VITAMIN D3) 125 MCG (5000 UT) CAPS Take 5,000 Units by mouth daily.    [provider]  fluticasone (FLONASE) 50 MCG/ACT nasal spray Place 1 spray into both nostrils daily as needed for allergies or rhinitis.     [provider]  insulin detemir (LEVEMIR) 100 UNIT/ML injection Inject 40 Units into the skin daily before breakfast.     [provider]  levothyroxine (SYNTHROID, LEVOTHROID) 175 MCG tablet Take 175 mcg by mouth daily before breakfast.    [provider]  metFORMIN (GLUCOPHAGE-XR) 500 MG 24 hr tablet Take 2,000 mg by mouth daily with breakfast.    [provider]  vitamin B-12 (CYANOCOBALAMIN) 1000 MCG tablet Take 1,000 mcg by mouth daily.    [provider]    Family History Family History  Problem Relation Age of Onset   Lung cancer Mother    High blood pressure Mother    High Cholesterol Mother    Esophageal cancer Father    Dementia Father    Alcoholism Father     Social History Social History   Tobacco Use   Smoking status: Never   Smokeless tobacco: Never  Vaping Use  Vaping Use: Never used  Substance Use Topics   Alcohol use: Yes    Comment: rare   Drug use: No     Allergies   Septra [sulfamethoxazole-trimethoprim], Sulfa antibiotics, Lactose intolerance (gi), Meloxicam, and Strawberry extract   Review of Systems Review of Systems   Physical Exam Triage Vital Signs ED Triage Vitals  Enc Vitals Group     BP 07/31/21 1558 136/83     Pulse Rate 07/31/21 1557 80     Resp 07/31/21 1557 17     Temp 07/31/21 1557 98.7 F (37.1 C)     Temp Source 07/31/21 1557 Oral     SpO2 07/31/21 1557 98 %     Weight --      Height --      Head Circumference --      Peak Flow --      Pain Score 07/31/21 1556 6     Pain Loc --      Pain Edu? --      Excl. in [place]? --    No data found.  Updated Vital Signs BP 136/83    Pulse 80    Temp 98.7 F (37.1 C) (Oral)    Resp 17    SpO2 98%    Visual Acuity Right Eye Distance:   Left Eye Distance:   Bilateral Distance:    Right Eye Near:   Left Eye Near:    Bilateral Near:     Physical Exam Vitals reviewed.  Constitutional:      General: She is not in acute distress.    Appearance: She is not toxic-appearing.  Musculoskeletal:        General: Tenderness (along right radial wrist. No swelling or erythema or ecchymosis) present.     Comments: FROM at the wrist/fingers  Neurological:     Mental Status: She is alert and oriented to person, place, and time.  Psychiatric:        Behavior: Behavior normal.     UC Treatments / Results  Labs (all labs ordered are listed, but only abnormal results are displayed) Labs Reviewed - No data to display  EKG   Radiology DG Wrist Complete Right  Result Date: 07/31/2021 CLINICAL DATA:  Pain EXAM: RIGHT WRIST - COMPLETE 3+ VIEW COMPARISON:  None. FINDINGS: There is no evidence of fracture or dislocation. There is no evidence of arthropathy or other focal bone abnormality. Soft tissues are unremarkable. IMPRESSION: No recent fracture or dislocation is seen in the right wrist. Electronically Signed   By: Elmer Picker M.D.   On: 07/31/2021 16:38    Procedures Procedures (including critical care time)  Medications Ordered in UC Medications - No data to display  Initial Impression / Assessment and Plan / UC Course  I have reviewed the triage vital signs and the nursing notes.  Pertinent labs & imaging results that were available during my care of the patient were reviewed by me and considered in my medical decision making (see chart for details).     Xray is negative for bony abnormality. Discussed most likely a tendinitis. She has a splint with a thumb extension Final Clinical Impressions(s) / UC Diagnoses   Final diagnoses:  Acute pain of right wrist     Discharge Instructions      Your xray was negative for fracture or other bony problem.  Wear your  splint. See you primary doctor in the next couple of weeks to a month if not improving. Tylenol as  needed for pain     ED Prescriptions   None    PDMP not reviewed this encounter.   Barrett Henle, MD 07/31/21 [phone]

## 2021-07-31 NOTE — Discharge Instructions (Signed)
Your xray was negative for fracture or other bony problem.  Wear your splint. See you primary doctor in the next couple of weeks to a month if not improving. Tylenol as needed for pain

## 2022-01-13 ENCOUNTER — Other Ambulatory Visit: Payer: Self-pay

## 2022-01-13 ENCOUNTER — Encounter (HOSPITAL_BASED_OUTPATIENT_CLINIC_OR_DEPARTMENT_OTHER): Payer: Self-pay | Admitting: Emergency Medicine

## 2022-01-13 DIAGNOSIS — E119 Type 2 diabetes mellitus without complications: Secondary | ICD-10-CM | POA: Insufficient documentation

## 2022-01-13 DIAGNOSIS — N23 Unspecified renal colic: Secondary | ICD-10-CM | POA: Diagnosis not present

## 2022-01-13 DIAGNOSIS — Z794 Long term (current) use of insulin: Secondary | ICD-10-CM | POA: Diagnosis not present

## 2022-01-13 DIAGNOSIS — Z7984 Long term (current) use of oral hypoglycemic drugs: Secondary | ICD-10-CM | POA: Insufficient documentation

## 2022-01-13 DIAGNOSIS — R1031 Right lower quadrant pain: Secondary | ICD-10-CM | POA: Diagnosis present

## 2022-01-13 LAB — URINALYSIS, ROUTINE W REFLEX MICROSCOPIC
Bilirubin Urine: NEGATIVE
Glucose, UA: NEGATIVE mg/dL
Ketones, ur: NEGATIVE mg/dL
Nitrite: NEGATIVE
Protein, ur: NEGATIVE mg/dL
RBC / HPF: >50 RBC/hpf — ABNORMAL HIGH (ref 0–5)
Specific Gravity, Urine: 1.023 (ref 1.005–1.030)
pH: 5 (ref 5.0–8.0)

## 2022-01-13 LAB — BASIC METABOLIC PANEL
Anion gap: 12 (ref 5–15)
BUN: 16 mg/dL (ref 6–20)
CO2: 22 mmol/L (ref 22–32)
Calcium: 10.3 mg/dL (ref 8.9–10.3)
Chloride: 102 mmol/L (ref 98–111)
Creatinine, Ser: 0.75 mg/dL (ref 0.44–1.00)
GFR, Estimated: >60 mL/min (ref >60)
Glucose, Bld: 169 mg/dL — ABNORMAL HIGH (ref 70–99)
Potassium: 4.2 mmol/L (ref 3.5–5.1)
Sodium: 136 mmol/L (ref 135–145)

## 2022-01-13 LAB — CBC
HCT: 38.3 % (ref 36.0–46.0)
Hemoglobin: 12.5 g/dL (ref 12.0–15.0)
MCH: 26.7 pg (ref 26.0–34.0)
MCHC: 32.6 g/dL (ref 30.0–36.0)
MCV: 81.8 fL (ref 80.0–100.0)
Platelets: 318 10*3/uL (ref 150–400)
RBC: 4.68 MIL/uL (ref 3.87–5.11)
RDW: 13.7 % (ref 11.5–15.5)
WBC: 7.6 10*3/uL (ref 4.0–10.5)
nRBC: 0 % (ref 0.0–0.2)

## 2022-01-13 NOTE — ED Triage Notes (Signed)
Pt via pov from home with right flank pain x 1 hour. Pt has hx of kidney stones and this feels the same. Pt states pain has eased a little since then but it is still intense. Pt alert & oriented, nad noted.

## 2022-01-14 ENCOUNTER — Emergency Department (HOSPITAL_BASED_OUTPATIENT_CLINIC_OR_DEPARTMENT_OTHER): Payer: Managed Care, Other (non HMO)

## 2022-01-14 ENCOUNTER — Encounter (HOSPITAL_BASED_OUTPATIENT_CLINIC_OR_DEPARTMENT_OTHER): Payer: Self-pay | Admitting: Emergency Medicine

## 2022-01-14 ENCOUNTER — Emergency Department (HOSPITAL_BASED_OUTPATIENT_CLINIC_OR_DEPARTMENT_OTHER)
Admission: EM | Admit: 2022-01-14 | Discharge: 2022-01-14 | Disposition: A | Discharge status: Home / Self Care | Payer: Managed Care, Other (non HMO) | Attending: Emergency Medicine | Admitting: Emergency Medicine

## 2022-01-14 DIAGNOSIS — N23 Unspecified renal colic: Secondary | ICD-10-CM

## 2022-01-14 HISTORY — DX: Calculus of ureter: N20.1

## 2022-01-14 NOTE — ED Provider Notes (Signed)
[facility] Provider Note: Georgena Spurling, MD, FACEP  CSN: [id number] MRN: [id number] ARRIVAL: 01/13/22 at 2018 ROOM: DB016/DB016   CHIEF COMPLAINT  Flank Pain   HISTORY OF PRESENT ILLNESS  01/14/22 5:10 AM [name] is a 61 y.o. female with a history of kidney stones.  She is here with right flank pain that began an hour prior to arrival.  She describes the pain as like previous kidney stones and she rated the pain as a 4 out of 10 on arrival.  Pain was more severe earlier but had improved and she believes she passed the stone.  She was having difficulty urinating earlier but was able to provide a urine specimen on arrival.  She denies any pain at the present time.  She did not have any nausea with this.   Past Medical History:  Diagnosis Date   Allergy    seasonal   Cataract    early signs in both eyes   Diabetes mellitus without complication ([place])    History of rectal bleeding    in hospital on 04/25/18   Hyperlipidemia    Sleep apnea    uses c-pap   Thyroid disease    Ureterolithiasis     Past Surgical History:  Procedure Laterality Date   jaw reduction     1984   NASAL SEPTUM SURGERY  2000   SHOULDER SURGERY  2017   arthroscopic surg on left shoulder due bone spurs    Family History  Problem Relation Age of Onset   Lung cancer Mother    High blood pressure Mother    High Cholesterol Mother    Esophageal cancer Father    Dementia Father    Alcoholism Father     Social History   Tobacco Use   Smoking status: Never   Smokeless tobacco: Never  Vaping Use   Vaping Use: Never used  Substance Use Topics   Alcohol use: Yes    Comment: rare   Drug use: No    Prior to Admission medications   Medication Sig Start Date End Date Taking? Authorizing Provider  acetaminophen (TYLENOL) 325 MG tablet Take 2 tablets (650 mg total) by mouth every 6 (six) hours as needed for mild pain (or Fever >/= 101). Patient taking differently: Take 650 mg by  mouth 3 (three) times daily as needed for mild pain (or Fever >/= 101).  04/26/18   Georgette Shell, MD  atorvastatin (LIPITOR) 20 MG tablet Take 20 mg by mouth daily.    [provider]  BD PEN NEEDLE NANO U/F 32G X 4 MM MISC SMARTSIG:1 Each SUB-Q Daily 03/25/20   [provider]  Cholecalciferol (VITAMIN D3) 125 MCG (5000 UT) CAPS Take 5,000 Units by mouth daily.    [provider]  fluticasone (FLONASE) 50 MCG/ACT nasal spray Place 1 spray into both nostrils daily as needed for allergies or rhinitis.     [provider]  insulin detemir (LEVEMIR) 100 UNIT/ML injection Inject 40 Units into the skin daily before breakfast.     [provider]  levothyroxine (SYNTHROID, LEVOTHROID) 175 MCG tablet Take 175 mcg by mouth daily before breakfast.    [provider]  metFORMIN (GLUCOPHAGE-XR) 500 MG 24 hr tablet Take 2,000 mg by mouth daily with breakfast.    [provider]  vitamin B-12 (CYANOCOBALAMIN) 1000 MCG tablet Take 1,000 mcg by mouth daily.    [provider]    Allergies Septra [sulfamethoxazole-trimethoprim], Sulfa antibiotics,  Lactose intolerance (gi), Meloxicam, and Strawberry extract   REVIEW OF SYSTEMS  Negative except as noted here or in the History of Present Illness.   PHYSICAL EXAMINATION  Initial Vital Signs Blood pressure 119/72, pulse 72, temperature 98.1 F (36.7 C), resp. rate 16, height '5\' 4"'$  (1.626 m), weight 101.6 kg, SpO2 95 %.  Examination General: Well-developed, well-nourished female in no acute distress; appearance consistent with age of record HENT: normocephalic; atraumatic Eyes: Normal appearance Neck: supple Heart: regular rate and rhythm Lungs: clear to auscultation bilaterally Abdomen: soft; nondistended; nontender; bowel sounds present GU: No CVA tenderness Extremities: No deformity; full range of motion; pulses normal Neurologic: Awake, alert and oriented; motor function  intact in all extremities and symmetric; no facial droop Skin: Warm and dry Psychiatric: Normal mood and affect   RESULTS  Summary of this visit's results, reviewed and interpreted by myself:   EKG Interpretation  Date/Time:    Ventricular Rate:    PR Interval:    QRS Duration:   QT Interval:    QTC Calculation:   R Axis:     Text Interpretation:         Laboratory Studies: Results for orders placed or performed during the hospital encounter of 01/14/22 (from the past 24 hour(s))  Urinalysis, Routine w reflex microscopic Urine, Clean Catch     Status: Abnormal   Collection Time: 01/13/22  8:58 PM  Result Value Ref Range   Color, Urine YELLOW YELLOW   APPearance CLEAR CLEAR   Specific Gravity, Urine 1.023 1.005 - 1.030   pH 5.0 5.0 - 8.0   Glucose, UA NEGATIVE NEGATIVE mg/dL   Hgb urine dipstick LARGE (A) NEGATIVE   Bilirubin Urine NEGATIVE NEGATIVE   Ketones, ur NEGATIVE NEGATIVE mg/dL   Protein, ur NEGATIVE NEGATIVE mg/dL   Nitrite NEGATIVE NEGATIVE   Leukocytes,Ua TRACE (A) NEGATIVE   RBC / HPF >50 (H) 0 - 5 RBC/hpf   WBC, UA 6-10 0 - 5 WBC/hpf   Bacteria, UA RARE (A) NONE SEEN   Squamous Epithelial / LPF 0-5 0 - 5   Mucus PRESENT   Basic metabolic panel     Status: Abnormal   Collection Time: 01/13/22  9:01 PM  Result Value Ref Range   Sodium 136 135 - 145 mmol/L   Potassium 4.2 3.5 - 5.1 mmol/L   Chloride 102 98 - 111 mmol/L   CO2 22 22 - 32 mmol/L   Glucose, Bld 169 (H) 70 - 99 mg/dL   BUN 16 6 - 20 mg/dL   Creatinine, Ser 0.75 0.44 - 1.00 mg/dL   Calcium 10.3 8.9 - 10.3 mg/dL   GFR, Estimated >60 >60 mL/min   Anion gap 12 5 - 15  CBC     Status: None   Collection Time: 01/13/22  9:01 PM  Result Value Ref Range   WBC 7.6 4.0 - 10.5 K/uL   RBC 4.68 3.87 - 5.11 MIL/uL   Hemoglobin 12.5 12.0 - 15.0 g/dL   HCT 38.3 36.0 - 46.0 %   MCV 81.8 80.0 - 100.0 fL   MCH 26.7 26.0 - 34.0 pg   MCHC 32.6 30.0 - 36.0 g/dL   RDW 13.7 11.5 - 15.5 %   Platelets 318  150 - 400 K/uL   nRBC 0.0 0.0 - 0.2 %   Imaging Studies: US Renal  Result Date: 01/14/2022 CLINICAL DATA:  Right flank pain. EXAM: RENAL / URINARY TRACT ULTRASOUND COMPLETE COMPARISON:  CT abdomen and pelvis no contrast  04/22/2017. FINDINGS: Right Kidney: Renal measurements: 11.7 x 5.6 x 6.0 cm = volume: 205 mL. Echogenicity within normal limits. No mass, stones or hydronephrosis visualized. There is a 7.4 mm simple cyst of the outer midpole cortex not seen previously, exophytic 1.9 cm simple cyst in the inferior pole which was previously 1.3 cm . Left Kidney: Renal measurements: 13.3 x 5.5 x 5.9 cm = volume: 227 mL. Echogenicity within normal limits. No mass or hydronephrosis visualized. There is a 9 mm stone in the inferior pole which was previously 5 mm. Bladder: Appears normal for degree of bladder distention. Both ureteral jets are visible. Other: None. IMPRESSION: Small cysts on the right, and a 9 mm nonobstructive inferior pole caliceal stone on the left. No hydronephrosis or acute sonographic findings. Normal cortical echogenicity and thickness. Both ureteral jets are seen. Electronically Signed   By: Telford Nab M.D.   On: 01/14/2022 04:56    ED COURSE and MDM  Nursing notes, initial and subsequent vitals signs, including pulse oximetry, reviewed and interpreted by myself.  Vitals:   01/14/22 0200 01/14/22 0300 01/14/22 0345 01/14/22 0430  BP: (!) 105/59 105/63 109/67 119/72  Pulse: 83 69 77 72  Resp: '18 18 18 16  '$ Temp:      SpO2: 99% 93% 95% 95%  Weight:      Height:       Medications - No data to display  The patient's severe, sudden onset flank pain with hematuria and now an unremarkable ultrasound is consistent with a past ureteral stone.  She was advised of the remaining 9 mm nonobstructing stone on the left kidney.  PROCEDURES  Procedures   ED DIAGNOSES     ICD-10-CM   1. Ureteral colic  B86          Ariyona Eid, MD 01/14/22 [id number]

## 2022-01-28 ENCOUNTER — Encounter (HOSPITAL_BASED_OUTPATIENT_CLINIC_OR_DEPARTMENT_OTHER): Payer: Self-pay | Admitting: Emergency Medicine

## 2022-01-28 ENCOUNTER — Other Ambulatory Visit: Payer: Self-pay

## 2022-01-28 ENCOUNTER — Emergency Department (HOSPITAL_BASED_OUTPATIENT_CLINIC_OR_DEPARTMENT_OTHER)
Admission: EM | Admit: 2022-01-28 | Discharge: 2022-01-28 | Disposition: A | Discharge status: Home / Self Care | Payer: Managed Care, Other (non HMO) | Attending: Emergency Medicine | Admitting: Emergency Medicine

## 2022-01-28 DIAGNOSIS — E119 Type 2 diabetes mellitus without complications: Secondary | ICD-10-CM | POA: Insufficient documentation

## 2022-01-28 DIAGNOSIS — Z794 Long term (current) use of insulin: Secondary | ICD-10-CM | POA: Diagnosis not present

## 2022-01-28 DIAGNOSIS — R109 Unspecified abdominal pain: Secondary | ICD-10-CM | POA: Diagnosis present

## 2022-01-28 DIAGNOSIS — Z7984 Long term (current) use of oral hypoglycemic drugs: Secondary | ICD-10-CM | POA: Insufficient documentation

## 2022-01-28 DIAGNOSIS — N200 Calculus of kidney: Secondary | ICD-10-CM | POA: Insufficient documentation

## 2022-01-28 LAB — URINALYSIS, ROUTINE W REFLEX MICROSCOPIC
Bilirubin Urine: NEGATIVE
Glucose, UA: >=500 mg/dL — AB
Ketones, ur: NEGATIVE mg/dL
Nitrite: NEGATIVE
Protein, ur: NEGATIVE mg/dL
Specific Gravity, Urine: 1.025 (ref 1.005–1.030)
pH: 6 (ref 5.0–8.0)

## 2022-01-28 LAB — BASIC METABOLIC PANEL
Anion gap: 6 (ref 5–15)
BUN: 15 mg/dL (ref 6–20)
CO2: 25 mmol/L (ref 22–32)
Calcium: 8.6 mg/dL — ABNORMAL LOW (ref 8.9–10.3)
Chloride: 105 mmol/L (ref 98–111)
Creatinine, Ser: 0.7 mg/dL (ref 0.44–1.00)
GFR, Estimated: >60 mL/min (ref >60)
Glucose, Bld: 213 mg/dL — ABNORMAL HIGH (ref 70–99)
Potassium: 3.8 mmol/L (ref 3.5–5.1)
Sodium: 136 mmol/L (ref 135–145)

## 2022-01-28 LAB — URINALYSIS, MICROSCOPIC (REFLEX)

## 2022-01-28 MED ORDER — TAMSULOSIN HCL 0.4 MG PO CAPS: 0.4000 mg | ORAL_CAPSULE | Freq: Every day | ORAL | Status: DC

## 2022-01-28 MED ORDER — TAMSULOSIN HCL 0.4 MG PO CAPS: 0.4000 mg | ORAL_CAPSULE | Freq: Every day | ORAL | 0 refills | Status: AC

## 2022-01-28 NOTE — ED Provider Notes (Signed)
[facility] EMERGENCY DEPARTMENT Provider Note   CSN: [id number] Arrival date & time: 01/28/22  0932     History  Chief Complaint  Patient presents with   Flank Pain    [name] is a 61 y.o. female with history of diabetes, hyperlipidemia, previous kidney stones who presents to the ED for evaluation of right flank pain that began yesterday and continued on until this morning.  Patient states she is currently feeling much better and believes that the kidney stone is now passed into her bladder and rates her recurrent pain 2 out of 10.  She also endorses decreased urinary output.  She denies nausea and vomiting.  She was seen here in the emergency department 2 weeks ago for similar complaints and had an ultrasound done which did not show any evidence of a stone in the right ureter.  The left kidney has a 9 mm nonobstructive stone that patient states has been there for quite a while and was told that it will likely not cause any problems.  She has no left-sided flank pain.  Per the imaging done 2 weeks ago, it was assumed that she had passed the kidney stone on her own.  She was not sent home with Flomax.  Patient states that she last had a kidney stone 3 to 4 years ago and at that time was prescribed Flomax which seemed to help with her symptoms significantly.  She states that now that her pain has improved and she believes she has Passed the stone, she would like a prescription of Flomax in case symptoms return.  Additionally, she does not follow with a urologist.  She would like to get a referral from her PCP.  She denies fever, chills, nausea, vomiting, diarrhea and all other systemic complaints.   Flank Pain Pertinent negatives include no abdominal pain.       Home Medications Prior to Admission medications   Medication Sig Start Date End Date Taking? Authorizing Provider  tamsulosin (FLOMAX) 0.4 MG CAPS capsule Take 1 capsule (0.4 mg total) by mouth daily. 01/28/22   Yes Tonye Pearson, PA-C  acetaminophen (TYLENOL) 325 MG tablet Take 2 tablets (650 mg total) by mouth every 6 (six) hours as needed for mild pain (or Fever >/= 101). Patient taking differently: Take 650 mg by mouth 3 (three) times daily as needed for mild pain (or Fever >/= 101).  04/26/18   Georgette Shell, MD  atorvastatin (LIPITOR) 20 MG tablet Take 20 mg by mouth daily.    [provider]  BD PEN NEEDLE NANO U/F 32G X 4 MM MISC SMARTSIG:1 Each SUB-Q Daily 03/25/20   [provider]  Cholecalciferol (VITAMIN D3) 125 MCG (5000 UT) CAPS Take 5,000 Units by mouth daily.    [provider]  fluticasone (FLONASE) 50 MCG/ACT nasal spray Place 1 spray into both nostrils daily as needed for allergies or rhinitis.     [provider]  insulin detemir (LEVEMIR) 100 UNIT/ML injection Inject 40 Units into the skin daily before breakfast.     [provider]  levothyroxine (SYNTHROID, LEVOTHROID) 175 MCG tablet Take 175 mcg by mouth daily before breakfast.    [provider]  metFORMIN (GLUCOPHAGE-XR) 500 MG 24 hr tablet Take 2,000 mg by mouth daily with breakfast.    [provider]  TRULICITY 1.5 ZS/0.1UX SOPN Inject 1.5 mg into the skin once a week. 11/04/21   [provider]  vitamin B-12 (CYANOCOBALAMIN) 1000 MCG  tablet Take 1,000 mcg by mouth daily.    [provider]      Allergies    Septra [sulfamethoxazole-trimethoprim], Sulfa antibiotics, Lactose intolerance (gi), Meloxicam, Strawberry extract, and Sertraline hcl    Review of Systems   Review of Systems  Gastrointestinal:  Negative for abdominal pain, diarrhea and vomiting.  Genitourinary:  Positive for flank pain. Negative for hematuria.    Physical Exam Updated Vital Signs BP (!) 144/68   Pulse 77   Temp 98.5 F (36.9 C) (Oral)   Resp 18   Ht '5\' 4"'$  (1.626 m)   Wt 99.8 kg   SpO2 98%   BMI 37.76 kg/m  Physical Exam Vitals and nursing note  reviewed.  Constitutional:      General: She is not in acute distress.    Appearance: She is not ill-appearing.     Comments: Patient is pleasant, smiling and in no acute distress  HENT:     Head: Atraumatic.  Eyes:     Conjunctiva/sclera: Conjunctivae normal.  Cardiovascular:     Rate and Rhythm: Normal rate and regular rhythm.     Pulses: Normal pulses.     Heart sounds: No murmur heard. Pulmonary:     Effort: Pulmonary effort is normal. No respiratory distress.     Breath sounds: Normal breath sounds.  Abdominal:     General: Abdomen is flat. There is no distension.     Palpations: Abdomen is soft.     Tenderness: There is no abdominal tenderness. There is no right CVA tenderness or left CVA tenderness.  Musculoskeletal:        General: Normal range of motion.     Cervical back: Normal range of motion.  Skin:    General: Skin is warm and dry.     Capillary Refill: Capillary refill takes less than 2 seconds.  Neurological:     General: No focal deficit present.     Mental Status: She is alert.  Psychiatric:        Mood and Affect: Mood normal.     ED Results / Procedures / Treatments   Labs (all labs ordered are listed, but only abnormal results are displayed) Labs Reviewed  URINALYSIS, ROUTINE W REFLEX MICROSCOPIC - Abnormal; Notable for the following components:      Result Value   APPearance HAZY (*)    Glucose, UA >=500 (*)    Hgb urine dipstick MODERATE (*)    Leukocytes,Ua TRACE (*)    All other components within normal limits  URINALYSIS, MICROSCOPIC (REFLEX) - Abnormal; Notable for the following components:   Bacteria, UA RARE (*)    All other components within normal limits  BASIC METABOLIC PANEL    EKG None  Radiology No results found.  Procedures Procedures    Medications Ordered in ED Medications - No data to display  ED Course/ Medical Decision Making/ A&P                           Medical Decision Making Amount and/or Complexity of  Data Reviewed Labs: ordered.  Risk Prescription drug management.   Social determinants of health:  Social History   Socioeconomic History   Marital status: Divorced    Spouse name: Not on file   Number of children: Not on file   Years of education: Not on file   Highest education level: Not on file  Occupational History   Not on file  Tobacco Use  Smoking status: Never   Smokeless tobacco: Never  Vaping Use   Vaping Use: Never used  Substance and Sexual Activity   Alcohol use: Yes    Comment: rare   Drug use: No   Sexual activity: Not on file  Other Topics Concern   Not on file  Social History Narrative   Not on file   Social Determinants of Health   Financial Resource Strain: Not on file  Food Insecurity: Not on file  Transportation Needs: Not on file  Physical Activity: Not on file  Stress: Not on file  Social Connections: Not on file  Intimate Partner Violence: Not on file     Initial impression:  This patient presents to the ED for concern of right flank pain, this involves an extensive number of treatment options, and is a complaint that carries with it a high risk of complications and morbidity.   Differentials include kidney stone, UTI, pyelonephritis, musculoskeletal.   Comorbidities affecting care:  Per HPI   Additional history obtained: US imaging done 6/18  Lab Tests  I Ordered, reviewed, and interpreted labs and EKG.  The pertinent results include:  UA consistent with stone, no evidence of infection  BMP normal  ED Course/Re-evaluation: Presents in no acute distress and reports that her pain has resolved while waiting in the waiting room.  She believes that she has passed her stone.  On exam, she is pleasant and in no acute distress.  Abdomen is soft, nondistended and nontender to palpation.  Negative CVA tenderness bilaterally.  Her urine is consistent with a kidney stone with large amounts of blood without evidence of infection.  Bladder  scan identified 20 mL of urine within the bladder.  No evidence of urinary retention.  Given that her symptoms have resolved and she believes that she has passed her stone, I discussed with patient 2 options to include obtaining BMP to assess kidney function and then discharged home with urology follow-up and Flomax versus obtaining CT renal stone for better characterization of the kidneys, bladder and any residual stones.  Patient states that she would prefer to simply check her kidney function and be discharged home with Flomax.  She would prefer to receive urology referral from her PCP.  I gave her referral to [facility] regardless in case she is unable to receive 1 from her PCP.  Kidney function normal on BMP.  Return precautions were discussed.  Patient expresses understanding and is amenable to plan.  Disposition:  After consideration of the diagnostic results, physical exam, history and the patients response to treatment feel that the patent would benefit from discharge.   Kidney stone, right: Plan and management as described above. Discharged home in good condition.  Final Clinical Impression(s) / ED Diagnoses Final diagnoses:  Kidney stone on right side    Rx / DC Orders ED Discharge Orders          Ordered    tamsulosin (FLOMAX) 0.4 MG CAPS capsule  Daily        01/28/22 1027              Tonye Pearson, [place] 01/28/22 1110    Malvin Johns, MD 01/28/22 1524

## 2022-01-28 NOTE — ED Triage Notes (Signed)
Pt arrives pov, steady gait, c/o right flank pain and lower back pain with urinary retention. Recent tx for kidney stones

## 2022-01-28 NOTE — Discharge Instructions (Addendum)
Since your symptoms have largely improved and you believe the stone has passed into your bladder, we will hold off on the CT renal stone for now. Your urine sample and kidney function both look good. Continue to drink plenty of water - at least 2 L a day - to help flush out an residual stones. Additionally I have sent in some flomax for you if your symptoms return.   I have also given you a referral to [facility] in [place]. You can follow up with them or with the urologist recommended by your PCP.

## 2024-01-01 ENCOUNTER — Encounter: Payer: Self-pay | Admitting: Family Medicine
